# Patient Record
Sex: Female | Born: 1965 | Race: Black or African American | Hispanic: No | Marital: Single | Smoking: Never smoker
Health system: Southern US, Community
[De-identification: ages and names within clinical notes are randomized; demographics above are authoritative.]

## PROBLEM LIST (undated history)

## (undated) DIAGNOSIS — E785 Hyperlipidemia, unspecified: Secondary | ICD-10-CM

## (undated) DIAGNOSIS — M545 Low back pain, unspecified: Secondary | ICD-10-CM

## (undated) DIAGNOSIS — I10 Essential (primary) hypertension: Secondary | ICD-10-CM

## (undated) HISTORY — PX: LEG SURGERY: SHX1003

---

## 1999-11-30 ENCOUNTER — Emergency Department (HOSPITAL_COMMUNITY): Admission: EM | Admit: 1999-11-30 | Discharge: 1999-11-30 | Payer: Self-pay | Admitting: Emergency Medicine

## 2000-10-30 ENCOUNTER — Emergency Department (HOSPITAL_COMMUNITY): Admission: EM | Admit: 2000-10-30 | Discharge: 2000-10-30 | Payer: Self-pay

## 2002-10-29 ENCOUNTER — Ambulatory Visit (HOSPITAL_COMMUNITY): Admission: RE | Admit: 2002-10-29 | Discharge: 2002-10-29 | Payer: Self-pay | Admitting: Obstetrics

## 2002-10-29 ENCOUNTER — Encounter: Payer: Self-pay | Admitting: Obstetrics

## 2006-11-21 ENCOUNTER — Ambulatory Visit: Payer: Self-pay | Admitting: Family Medicine

## 2006-11-21 ENCOUNTER — Encounter (INDEPENDENT_AMBULATORY_CARE_PROVIDER_SITE_OTHER): Payer: Self-pay | Admitting: Nurse Practitioner

## 2006-11-21 LAB — CONVERTED CEMR LAB
BUN: 13 mg/dL (ref 6–23)
Basophils Absolute: 0.1 10*3/uL (ref 0.0–0.1)
CO2: 26 meq/L (ref 19–32)
Calcium: 9.3 mg/dL (ref 8.4–10.5)
Chloride: 104 meq/L (ref 96–112)
Creatinine, Ser: 0.89 mg/dL (ref 0.40–1.20)
Eosinophils Relative: 4 % (ref 0–5)
Glucose, Bld: 85 mg/dL (ref 70–99)
HCT: 39 % (ref 36.0–46.0)
Lymphocytes Relative: 37 % (ref 12–46)
Neutro Abs: 2 10*3/uL (ref 1.7–7.7)
Neutrophils Relative %: 46 % (ref 43–77)
Platelets: 283 10*3/uL (ref 150–400)
RDW: 16.6 % — ABNORMAL HIGH (ref 11.5–14.0)
TSH: 0.784 microintl units/mL (ref 0.350–5.50)

## 2006-11-23 ENCOUNTER — Ambulatory Visit: Payer: Self-pay | Admitting: *Deleted

## 2006-12-10 ENCOUNTER — Ambulatory Visit: Payer: Self-pay | Admitting: Family Medicine

## 2006-12-13 ENCOUNTER — Ambulatory Visit (HOSPITAL_COMMUNITY): Admission: RE | Admit: 2006-12-13 | Discharge: 2006-12-13 | Payer: Self-pay | Admitting: Family Medicine

## 2006-12-24 ENCOUNTER — Ambulatory Visit: Payer: Self-pay | Admitting: Internal Medicine

## 2007-01-07 ENCOUNTER — Ambulatory Visit: Payer: Self-pay | Admitting: Family Medicine

## 2007-02-13 ENCOUNTER — Ambulatory Visit: Payer: Self-pay | Admitting: *Deleted

## 2007-03-20 ENCOUNTER — Ambulatory Visit: Payer: Self-pay | Admitting: Internal Medicine

## 2007-03-20 ENCOUNTER — Encounter (INDEPENDENT_AMBULATORY_CARE_PROVIDER_SITE_OTHER): Payer: Self-pay | Admitting: Nurse Practitioner

## 2007-03-20 LAB — CONVERTED CEMR LAB
CO2: 24 meq/L (ref 19–32)
Chloride: 104 meq/L (ref 96–112)
Cholesterol: 170 mg/dL (ref 0–200)
Creatinine, Ser: 0.79 mg/dL (ref 0.40–1.20)
LDL Cholesterol: 96 mg/dL (ref 0–99)
Total Bilirubin: 0.3 mg/dL (ref 0.3–1.2)
Total CHOL/HDL Ratio: 3.1
Total Protein: 7.3 g/dL (ref 6.0–8.3)
Triglycerides: 100 mg/dL (ref <150)
VLDL: 20 mg/dL (ref 0–40)

## 2007-03-29 ENCOUNTER — Ambulatory Visit (HOSPITAL_COMMUNITY): Admission: RE | Admit: 2007-03-29 | Discharge: 2007-03-29 | Payer: Self-pay | Admitting: Family Medicine

## 2009-02-19 ENCOUNTER — Inpatient Hospital Stay (HOSPITAL_COMMUNITY)
Admission: EM | Admit: 2009-02-19 | Discharge: 2009-03-06 | Payer: Self-pay | Source: Home / Self Care | Admitting: Emergency Medicine

## 2009-02-25 ENCOUNTER — Ambulatory Visit: Payer: Self-pay | Admitting: Vascular Surgery

## 2009-02-25 ENCOUNTER — Encounter (INDEPENDENT_AMBULATORY_CARE_PROVIDER_SITE_OTHER): Payer: Self-pay | Admitting: General Surgery

## 2009-03-03 ENCOUNTER — Encounter (INDEPENDENT_AMBULATORY_CARE_PROVIDER_SITE_OTHER): Payer: Self-pay | Admitting: General Surgery

## 2010-04-11 LAB — URINALYSIS, ROUTINE W REFLEX MICROSCOPIC
Bilirubin Urine: NEGATIVE
Glucose, UA: NEGATIVE mg/dL
Ketones, ur: NEGATIVE mg/dL
Nitrite: NEGATIVE
pH: 6 (ref 5.0–8.0)

## 2010-04-11 LAB — POCT I-STAT 4, (NA,K, GLUC, HGB,HCT)
Glucose, Bld: 123 mg/dL — ABNORMAL HIGH (ref 70–99)
Glucose, Bld: 97 mg/dL (ref 70–99)
HCT: 41 % (ref 36.0–46.0)
Hemoglobin: 13.9 g/dL (ref 12.0–15.0)
Hemoglobin: 8.5 g/dL — ABNORMAL LOW (ref 12.0–15.0)
Potassium: 3.9 mEq/L (ref 3.5–5.1)
Potassium: 3.9 mEq/L (ref 3.5–5.1)
Sodium: 138 mEq/L (ref 135–145)
Sodium: 138 mEq/L (ref 135–145)

## 2010-04-11 LAB — COMPREHENSIVE METABOLIC PANEL
Alkaline Phosphatase: 88 U/L (ref 39–117)
BUN: 11 mg/dL (ref 6–23)
Calcium: 8.8 mg/dL (ref 8.4–10.5)
Creatinine, Ser: 0.83 mg/dL (ref 0.4–1.2)
GFR calc Af Amer: >60 mL/min (ref >60)
Glucose, Bld: 104 mg/dL — ABNORMAL HIGH (ref 70–99)
Total Protein: 7 g/dL (ref 6.0–8.3)

## 2010-04-11 LAB — TYPE AND SCREEN
ABO/RH(D): B POS
Antibody Screen: NEGATIVE

## 2010-04-11 LAB — URINE CULTURE: Colony Count: NO GROWTH

## 2010-04-11 LAB — CBC
HCT: 21.9 % — ABNORMAL LOW (ref 36.0–46.0)
HCT: 23.9 % — ABNORMAL LOW (ref 36.0–46.0)
HCT: 29.1 % — ABNORMAL LOW (ref 36.0–46.0)
Hemoglobin: 7.2 g/dL — ABNORMAL LOW (ref 12.0–15.0)
Hemoglobin: 9.6 g/dL — ABNORMAL LOW (ref 12.0–15.0)
MCHC: 32.8 g/dL (ref 30.0–36.0)
MCV: 90.5 fL (ref 78.0–100.0)
MCV: 90.9 fL (ref 78.0–100.0)
Platelets: 201 10*3/uL (ref 150–400)
Platelets: 231 10*3/uL (ref 150–400)
RBC: 3.23 MIL/uL — ABNORMAL LOW (ref 3.87–5.11)
RBC: 4.5 MIL/uL (ref 3.87–5.11)
RDW: 13.4 % (ref 11.5–15.5)
WBC: 13.3 10*3/uL — ABNORMAL HIGH (ref 4.0–10.5)
WBC: 7.5 10*3/uL (ref 4.0–10.5)
WBC: 8 10*3/uL (ref 4.0–10.5)

## 2010-04-11 LAB — BASIC METABOLIC PANEL
BUN: 5 mg/dL — ABNORMAL LOW (ref 6–23)
CO2: 27 mEq/L (ref 19–32)
Chloride: 105 mEq/L (ref 96–112)
Chloride: 106 mEq/L (ref 96–112)
GFR calc non Af Amer: >60 mL/min (ref >60)
GFR calc non Af Amer: >60 mL/min (ref >60)
Glucose, Bld: 122 mg/dL — ABNORMAL HIGH (ref 70–99)
Glucose, Bld: 162 mg/dL — ABNORMAL HIGH (ref 70–99)
Potassium: 3.4 mEq/L — ABNORMAL LOW (ref 3.5–5.1)
Potassium: 3.5 mEq/L (ref 3.5–5.1)
Potassium: 4 mEq/L (ref 3.5–5.1)
Sodium: 136 mEq/L (ref 135–145)
Sodium: 139 mEq/L (ref 135–145)

## 2010-04-11 LAB — URINE MICROSCOPIC-ADD ON

## 2010-04-11 LAB — HEMOGLOBIN AND HEMATOCRIT, BLOOD
HCT: 23.2 % — ABNORMAL LOW (ref 36.0–46.0)
Hemoglobin: 7.6 g/dL — ABNORMAL LOW (ref 12.0–15.0)

## 2010-04-11 LAB — PROTIME-INR: Prothrombin Time: 12.5 seconds (ref 11.6–15.2)

## 2010-04-11 LAB — POCT I-STAT, CHEM 8
Hemoglobin: 14.3 g/dL (ref 12.0–15.0)
Potassium: 3.6 mEq/L (ref 3.5–5.1)
Sodium: 140 mEq/L (ref 135–145)
TCO2: 26 mmol/L (ref 0–100)

## 2010-04-11 LAB — ABO/RH: ABO/RH(D): B POS

## 2010-04-11 LAB — POCT PREGNANCY, URINE: Preg Test, Ur: NEGATIVE

## 2010-04-11 LAB — APTT: aPTT: 29 seconds (ref 24–37)

## 2010-04-14 LAB — URINE MICROSCOPIC-ADD ON

## 2010-04-14 LAB — PROTIME-INR
INR: 1.6 — ABNORMAL HIGH (ref 0.00–1.49)
INR: 1.81 — ABNORMAL HIGH (ref 0.00–1.49)
INR: 1.82 — ABNORMAL HIGH (ref 0.00–1.49)
INR: 1.85 — ABNORMAL HIGH (ref 0.00–1.49)
INR: 2 — ABNORMAL HIGH (ref 0.00–1.49)
INR: 2.08 — ABNORMAL HIGH (ref 0.00–1.49)
INR: 2.15 — ABNORMAL HIGH (ref 0.00–1.49)
INR: 2.37 — ABNORMAL HIGH (ref 0.00–1.49)
INR: 3.1 — ABNORMAL HIGH (ref 0.00–1.49)
Prothrombin Time: 18.9 seconds — ABNORMAL HIGH (ref 11.6–15.2)
Prothrombin Time: 20.8 seconds — ABNORMAL HIGH (ref 11.6–15.2)
Prothrombin Time: 21.2 seconds — ABNORMAL HIGH (ref 11.6–15.2)
Prothrombin Time: 23.8 seconds — ABNORMAL HIGH (ref 11.6–15.2)
Prothrombin Time: 25.5 seconds — ABNORMAL HIGH (ref 11.6–15.2)

## 2010-04-14 LAB — CBC
HCT: 20.4 % — ABNORMAL LOW (ref 36.0–46.0)
HCT: 24.4 % — ABNORMAL LOW (ref 36.0–46.0)
Hemoglobin: 6.9 g/dL — CL (ref 12.0–15.0)
Hemoglobin: 7.7 g/dL — ABNORMAL LOW (ref 12.0–15.0)
Hemoglobin: 7.9 g/dL — ABNORMAL LOW (ref 12.0–15.0)
Hemoglobin: 8.2 g/dL — ABNORMAL LOW (ref 12.0–15.0)
MCHC: 33.8 g/dL (ref 30.0–36.0)
MCHC: 34.3 g/dL (ref 30.0–36.0)
MCV: 90.1 fL (ref 78.0–100.0)
MCV: 90.7 fL (ref 78.0–100.0)
Platelets: 177 10*3/uL (ref 150–400)
Platelets: 337 10*3/uL (ref 150–400)
Platelets: 395 10*3/uL (ref 150–400)
RBC: 2.53 MIL/uL — ABNORMAL LOW (ref 3.87–5.11)
RBC: 2.63 MIL/uL — ABNORMAL LOW (ref 3.87–5.11)
RBC: 3 MIL/uL — ABNORMAL LOW (ref 3.87–5.11)
RDW: 14.1 % (ref 11.5–15.5)
RDW: 14.3 % (ref 11.5–15.5)
RDW: 14.5 % (ref 11.5–15.5)
WBC: 10 10*3/uL (ref 4.0–10.5)
WBC: 11.5 10*3/uL — ABNORMAL HIGH (ref 4.0–10.5)
WBC: 11.6 10*3/uL — ABNORMAL HIGH (ref 4.0–10.5)
WBC: 13.7 10*3/uL — ABNORMAL HIGH (ref 4.0–10.5)
WBC: 9.9 10*3/uL (ref 4.0–10.5)

## 2010-04-14 LAB — BASIC METABOLIC PANEL
Calcium: 7.8 mg/dL — ABNORMAL LOW (ref 8.4–10.5)
Chloride: 104 mEq/L (ref 96–112)
Creatinine, Ser: 0.81 mg/dL (ref 0.4–1.2)
GFR calc Af Amer: >60 mL/min (ref >60)
GFR calc Af Amer: >60 mL/min (ref >60)
GFR calc non Af Amer: >60 mL/min (ref >60)
GFR calc non Af Amer: >60 mL/min (ref >60)
Glucose, Bld: 108 mg/dL — ABNORMAL HIGH (ref 70–99)
Potassium: 3.5 mEq/L (ref 3.5–5.1)
Potassium: 3.7 mEq/L (ref 3.5–5.1)
Sodium: 136 mEq/L (ref 135–145)
Sodium: 138 mEq/L (ref 135–145)

## 2010-04-14 LAB — URINE CULTURE
Colony Count: NO GROWTH
Culture: NO GROWTH

## 2010-04-14 LAB — CROSSMATCH

## 2010-04-14 LAB — CULTURE, BLOOD (ROUTINE X 2)
Culture: NO GROWTH
Culture: NO GROWTH

## 2010-04-14 LAB — URINALYSIS, ROUTINE W REFLEX MICROSCOPIC
Bilirubin Urine: NEGATIVE
Ketones, ur: 15 mg/dL — AB
Nitrite: NEGATIVE
Protein, ur: 30 mg/dL — AB
Specific Gravity, Urine: 1.03 (ref 1.005–1.030)
Urobilinogen, UA: 1 mg/dL (ref 0.0–1.0)

## 2010-04-14 LAB — DIFFERENTIAL
Basophils Absolute: 0.1 10*3/uL (ref 0.0–0.1)
Basophils Relative: 1 % (ref 0–1)
Lymphocytes Relative: 13 % (ref 12–46)
Neutro Abs: 9.3 10*3/uL — ABNORMAL HIGH (ref 1.7–7.7)
Neutrophils Relative %: 77 % (ref 43–77)

## 2010-04-14 LAB — EXPECTORATED SPUTUM ASSESSMENT W GRAM STAIN, RFLX TO RESP C

## 2011-04-05 ENCOUNTER — Other Ambulatory Visit (HOSPITAL_COMMUNITY): Payer: Self-pay | Admitting: Interventional Radiology

## 2011-05-03 ENCOUNTER — Other Ambulatory Visit (HOSPITAL_COMMUNITY): Payer: Self-pay | Admitting: Internal Medicine

## 2011-05-03 DIAGNOSIS — Z1231 Encounter for screening mammogram for malignant neoplasm of breast: Secondary | ICD-10-CM

## 2011-05-31 ENCOUNTER — Ambulatory Visit (HOSPITAL_COMMUNITY)
Admission: RE | Admit: 2011-05-31 | Discharge: 2011-05-31 | Disposition: A | Discharge status: Home / Self Care | Payer: Self-pay | Source: Ambulatory Visit | Attending: Internal Medicine | Admitting: Internal Medicine

## 2011-05-31 DIAGNOSIS — Z1231 Encounter for screening mammogram for malignant neoplasm of breast: Secondary | ICD-10-CM

## 2012-05-14 ENCOUNTER — Other Ambulatory Visit (HOSPITAL_COMMUNITY): Payer: Self-pay | Admitting: Internal Medicine

## 2012-05-14 DIAGNOSIS — Z1231 Encounter for screening mammogram for malignant neoplasm of breast: Secondary | ICD-10-CM

## 2012-06-03 ENCOUNTER — Ambulatory Visit (HOSPITAL_COMMUNITY)
Admission: RE | Admit: 2012-06-03 | Discharge: 2012-06-03 | Disposition: A | Discharge status: Home / Self Care | Payer: Self-pay | Source: Ambulatory Visit | Attending: Internal Medicine | Admitting: Internal Medicine

## 2012-06-03 DIAGNOSIS — Z1231 Encounter for screening mammogram for malignant neoplasm of breast: Secondary | ICD-10-CM

## 2012-06-04 ENCOUNTER — Other Ambulatory Visit (HOSPITAL_COMMUNITY): Payer: Self-pay | Admitting: Internal Medicine

## 2012-06-04 DIAGNOSIS — Z1231 Encounter for screening mammogram for malignant neoplasm of breast: Secondary | ICD-10-CM

## 2012-06-06 ENCOUNTER — Ambulatory Visit (HOSPITAL_COMMUNITY)
Admission: RE | Admit: 2012-06-06 | Discharge: 2012-06-06 | Disposition: A | Discharge status: Home / Self Care | Payer: Self-pay | Source: Ambulatory Visit | Attending: Internal Medicine | Admitting: Internal Medicine

## 2013-04-29 ENCOUNTER — Other Ambulatory Visit (HOSPITAL_COMMUNITY): Payer: Self-pay | Admitting: Nurse Practitioner

## 2013-04-29 DIAGNOSIS — Z1231 Encounter for screening mammogram for malignant neoplasm of breast: Secondary | ICD-10-CM

## 2013-06-09 ENCOUNTER — Ambulatory Visit (HOSPITAL_COMMUNITY): Admission: RE | Admit: 2013-06-09 | Payer: Self-pay | Source: Ambulatory Visit

## 2013-06-27 ENCOUNTER — Ambulatory Visit (HOSPITAL_COMMUNITY)
Admission: RE | Admit: 2013-06-27 | Discharge: 2013-06-27 | Disposition: A | Discharge status: Home / Self Care | Payer: Medicaid Other | Source: Ambulatory Visit | Attending: Nurse Practitioner | Admitting: Nurse Practitioner

## 2013-06-27 DIAGNOSIS — Z1231 Encounter for screening mammogram for malignant neoplasm of breast: Secondary | ICD-10-CM

## 2013-12-16 ENCOUNTER — Ambulatory Visit: Payer: Medicaid Other | Attending: Orthopedic Surgery | Admitting: Physical Therapy

## 2013-12-16 DIAGNOSIS — M5442 Lumbago with sciatica, left side: Secondary | ICD-10-CM | POA: Insufficient documentation

## 2013-12-16 DIAGNOSIS — M545 Low back pain: Secondary | ICD-10-CM

## 2013-12-16 DIAGNOSIS — M5441 Lumbago with sciatica, right side: Secondary | ICD-10-CM | POA: Diagnosis not present

## 2013-12-16 DIAGNOSIS — I10 Essential (primary) hypertension: Secondary | ICD-10-CM | POA: Diagnosis not present

## 2013-12-16 NOTE — Patient Instructions (Signed)
Leg Extension (Hamstring)   Sit toward front edge of chair, with leg out straight, heel on floor, toes pointing toward body. Keeping back straight, bend forward at hip, breathing out through pursed lips. Return, breathing in. Repeat __2-3_ times. Repeat with other leg. Do _1-2__ sessions per day. Variation: Perform from standing position, with support.  Hamstring Step 1   Straighten left knee. Keep knee level with other knee or on bolster. Hold _30__ seconds. Relax knee by returning foot to start. Repeat _3__ times.  Hamstring Stretch   With other leg bent, foot flat, grasp right leg and slowly try to straighten knee. Hold __30__ seconds. Repeat __2-3__ times. Do __1-2__ sessions per day.  http://[email or web address].exer.us/279     http://orth.exer.us/661   Copyright  VHI. All rights reserved.   Knee to Chest (Flexion)   Pull knee toward chest. Feel stretch in lower back or buttock area. Breathing deeply, Hold __30_ seconds. Repeat with other knee. Repeat __2-3__ times. Do ___1-2_ sessions per day.  Isometric Abdominal   Lying on back with knees bent,INHALE.  As you exhale, hollow belly by pulling your belly button in towards your spine. Hold ____ seconds. Repeat __10__ times per set. Do ___1_ sets per session. Do 1-2_ sessions per day.   Copyright  VHI. All rights reserved.     Posture Tips DO: - stand tall and erect - keep chin tucked in - keep head and shoulders in alignment - check posture regularly in mirror or large window - pull head back against headrest in car seat;  Change your position often.  Sit with lumbar support. DON'T: - slouch or slump while watching TV or reading - sit, stand or lie in one position  for too long;  Sitting is especially hard on the spine so if you sit at a desk/use the computer, then stand up often!   Copyright  VHI. All rights reserved.  Posture - Standing   Good posture is important. Avoid slouching and forward head thrust. Maintain curve  in low back and align ears over shoul- ders, hips over ankles.  Pull your belly button in toward your back bone.   Copyright  VHI. All rights reserved.  Posture - Sitting   Sit upright, head facing forward. Try using a roll to support lower back. Keep shoulders relaxed, and avoid rounded back. Keep hips level with knees. Avoid crossing legs for long periods.   Copyright  VHI. All rights reserved.   One Knee   Slide object up one thigh, and hold close at waist level with both hands before standing up.   Copyright  VHI. All rights reserved.  Lifting Principles .Maintain proper posture and head alignment. .Slide object as close as possible before lifting. .Move obstacles out of the way. .Test before lifting; ask for help if too heavy. .Tighten stomach muscles without holding breath. .Use smooth movements; do not jerk. .Use legs to do the work, and pivot with feet. .Distribute the work load symmetrically and close to the center of trunk. .Push instead of pull whenever possible.  Copyright  VHI. All rights reserved.  Deep Squat   Squat and lift with both arms held against upper trunk. Tighten stomach muscles without holding breath. Use smooth movements to avoid jerking.  Copyright  VHI. All rights reserved.  Low Shelf   Squat down, and bring item close to lift.   Copyright  VHI. All rights reserved.
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
## 2014-02-25 ENCOUNTER — Ambulatory Visit: Payer: Medicaid Other | Attending: Orthopedic Surgery

## 2014-02-25 DIAGNOSIS — M5441 Lumbago with sciatica, right side: Secondary | ICD-10-CM | POA: Insufficient documentation

## 2014-02-25 DIAGNOSIS — I10 Essential (primary) hypertension: Secondary | ICD-10-CM | POA: Insufficient documentation

## 2014-02-25 DIAGNOSIS — M5442 Lumbago with sciatica, left side: Secondary | ICD-10-CM | POA: Insufficient documentation

## 2014-02-25 DIAGNOSIS — M545 Low back pain: Secondary | ICD-10-CM

## 2014-02-25 NOTE — Therapy (Signed)
[facility] [address] [place], [place], [address] Phone: [phone]   Fax:  [phone]  Physical Therapy Treatment  Patient Details  Name: Morgan Jacobson MRN: [id number] Date of Birth: 02/04/1965 Referring Provider:  Gyarteng-Dakwa, Kwadwo,*  Encounter Date: 02/25/2014    No past medical history on file.  No past surgical history on file.  LMP 03/19/2012  Visit Diagnosis:  Bilateral low back pain, with sciatica presence unspecified                                      Plan - 02/25/14 0811    PT Next Visit Plan She was referred by new MD (Dakwa) and since she had an Evaluation in past 3 months it did not seem to be appropriate to charge her for a new eval so no eval done   Consulted and Agree with Plan of Care Patient        Problem List There are no active problems to display for this patient.   Chasse, Stephen M PT 02/25/2014, 8:14 AM  [facility] [address] [place], [place], [address] Phone: [phone]   Fax:  [phone]  PHYSICAL THERAPY DISCHARGE SUMMARY  Visits from Start of Care: 2  Current functional level related to goals / functional outcomes: No goals as she was not treated   Remaining deficits: No changes   Education / Equipment: She received a HEP at Eval in 11/2014  Plan: Patient agrees to discharge.  Patient goals were not met. Patient is being discharged due to financial reasons.  ?????        

## 2014-06-08 ENCOUNTER — Other Ambulatory Visit (HOSPITAL_COMMUNITY): Payer: Self-pay | Admitting: Nurse Practitioner

## 2014-06-08 DIAGNOSIS — Z1231 Encounter for screening mammogram for malignant neoplasm of breast: Secondary | ICD-10-CM

## 2014-06-30 ENCOUNTER — Ambulatory Visit (HOSPITAL_COMMUNITY)
Admission: RE | Admit: 2014-06-30 | Discharge: 2014-06-30 | Disposition: A | Discharge status: Home / Self Care | Payer: Medicaid Other | Source: Ambulatory Visit | Attending: Nurse Practitioner | Admitting: Nurse Practitioner

## 2014-06-30 DIAGNOSIS — Z1231 Encounter for screening mammogram for malignant neoplasm of breast: Secondary | ICD-10-CM | POA: Insufficient documentation

## 2015-05-27 ENCOUNTER — Other Ambulatory Visit: Payer: Self-pay

## 2015-05-27 DIAGNOSIS — Z1231 Encounter for screening mammogram for malignant neoplasm of breast: Secondary | ICD-10-CM

## 2015-07-01 ENCOUNTER — Ambulatory Visit: Payer: Medicaid Other

## 2015-07-05 ENCOUNTER — Ambulatory Visit: Payer: Medicaid Other

## 2015-07-12 ENCOUNTER — Ambulatory Visit
Admission: RE | Admit: 2015-07-12 | Discharge: 2015-07-12 | Disposition: A | Discharge status: Home / Self Care | Payer: Medicaid Other | Source: Ambulatory Visit

## 2015-07-12 DIAGNOSIS — Z1231 Encounter for screening mammogram for malignant neoplasm of breast: Secondary | ICD-10-CM

## 2015-09-15 ENCOUNTER — Ambulatory Visit: Payer: Self-pay | Admitting: Certified Nurse Midwife

## 2015-10-13 ENCOUNTER — Other Ambulatory Visit (HOSPITAL_COMMUNITY)
Admission: RE | Admit: 2015-10-13 | Discharge: 2015-10-13 | Disposition: A | Discharge status: Home / Self Care | Payer: Medicaid Other | Source: Ambulatory Visit | Attending: Family Medicine | Admitting: Family Medicine

## 2015-10-13 ENCOUNTER — Other Ambulatory Visit: Payer: Self-pay | Admitting: Family Medicine

## 2015-10-13 DIAGNOSIS — N760 Acute vaginitis: Secondary | ICD-10-CM | POA: Insufficient documentation

## 2015-10-13 DIAGNOSIS — Z1151 Encounter for screening for human papillomavirus (HPV): Secondary | ICD-10-CM | POA: Diagnosis not present

## 2015-10-13 DIAGNOSIS — Z113 Encounter for screening for infections with a predominantly sexual mode of transmission: Secondary | ICD-10-CM | POA: Insufficient documentation

## 2015-10-13 DIAGNOSIS — Z01419 Encounter for gynecological examination (general) (routine) without abnormal findings: Secondary | ICD-10-CM | POA: Insufficient documentation

## 2015-10-14 LAB — CYTOLOGY - PAP

## 2016-06-01 ENCOUNTER — Other Ambulatory Visit: Payer: Self-pay | Admitting: Family Medicine

## 2016-06-01 DIAGNOSIS — Z1231 Encounter for screening mammogram for malignant neoplasm of breast: Secondary | ICD-10-CM

## 2016-07-12 ENCOUNTER — Ambulatory Visit
Admission: RE | Admit: 2016-07-12 | Discharge: 2016-07-12 | Disposition: A | Discharge status: Home / Self Care | Payer: Medicaid Other | Source: Ambulatory Visit | Attending: Family Medicine | Admitting: Family Medicine

## 2016-07-12 DIAGNOSIS — Z1231 Encounter for screening mammogram for malignant neoplasm of breast: Secondary | ICD-10-CM

## 2017-06-01 ENCOUNTER — Other Ambulatory Visit: Payer: Self-pay | Admitting: Family Medicine

## 2017-06-01 DIAGNOSIS — Z1231 Encounter for screening mammogram for malignant neoplasm of breast: Secondary | ICD-10-CM

## 2017-07-16 ENCOUNTER — Ambulatory Visit: Payer: Self-pay

## 2017-08-08 ENCOUNTER — Ambulatory Visit: Payer: Medicaid Other

## 2017-09-04 ENCOUNTER — Ambulatory Visit: Payer: Medicaid Other

## 2017-10-03 ENCOUNTER — Ambulatory Visit: Payer: Medicaid Other

## 2017-11-06 ENCOUNTER — Ambulatory Visit: Payer: Medicaid Other

## 2017-12-13 ENCOUNTER — Ambulatory Visit
Admission: RE | Admit: 2017-12-13 | Discharge: 2017-12-13 | Disposition: A | Discharge status: Home / Self Care | Payer: Medicaid Other | Source: Ambulatory Visit | Attending: Family Medicine | Admitting: Family Medicine

## 2017-12-13 DIAGNOSIS — Z1231 Encounter for screening mammogram for malignant neoplasm of breast: Secondary | ICD-10-CM

## 2018-11-04 ENCOUNTER — Other Ambulatory Visit: Payer: Self-pay | Admitting: Family Medicine

## 2018-11-04 DIAGNOSIS — Z1231 Encounter for screening mammogram for malignant neoplasm of breast: Secondary | ICD-10-CM

## 2018-12-18 ENCOUNTER — Ambulatory Visit: Payer: Medicaid Other

## 2019-02-11 ENCOUNTER — Other Ambulatory Visit: Payer: Self-pay

## 2019-02-11 ENCOUNTER — Ambulatory Visit
Admission: RE | Admit: 2019-02-11 | Discharge: 2019-02-11 | Disposition: A | Discharge status: Home / Self Care | Payer: Medicaid Other | Source: Ambulatory Visit | Attending: Family Medicine | Admitting: Family Medicine

## 2019-02-11 DIAGNOSIS — Z1231 Encounter for screening mammogram for malignant neoplasm of breast: Secondary | ICD-10-CM

## 2019-02-25 ENCOUNTER — Other Ambulatory Visit: Payer: Self-pay | Admitting: Family Medicine

## 2019-02-25 ENCOUNTER — Ambulatory Visit
Admission: RE | Admit: 2019-02-25 | Discharge: 2019-02-25 | Disposition: A | Discharge status: Home / Self Care | Payer: Medicaid Other | Source: Ambulatory Visit | Attending: Family Medicine | Admitting: Family Medicine

## 2019-02-25 DIAGNOSIS — M545 Low back pain, unspecified: Secondary | ICD-10-CM

## 2019-03-18 ENCOUNTER — Encounter (HOSPITAL_COMMUNITY): Payer: Self-pay

## 2019-03-18 ENCOUNTER — Other Ambulatory Visit: Payer: Self-pay

## 2019-03-18 ENCOUNTER — Ambulatory Visit (HOSPITAL_COMMUNITY)
Admission: EM | Admit: 2019-03-18 | Discharge: 2019-03-18 | Disposition: A | Discharge status: Home / Self Care | Payer: Medicaid Other

## 2019-03-18 DIAGNOSIS — M5441 Lumbago with sciatica, right side: Secondary | ICD-10-CM | POA: Diagnosis not present

## 2019-03-18 DIAGNOSIS — M5417 Radiculopathy, lumbosacral region: Secondary | ICD-10-CM

## 2019-03-18 DIAGNOSIS — G8929 Other chronic pain: Secondary | ICD-10-CM

## 2019-03-18 DIAGNOSIS — M5442 Lumbago with sciatica, left side: Secondary | ICD-10-CM

## 2019-03-18 MED ORDER — DICLOFENAC SODIUM 1 % EX GEL: 2.0000 g | Freq: Four times a day (QID) | CUTANEOUS | 0 refills | Status: AC

## 2019-03-18 MED ORDER — TRAMADOL HCL 50 MG PO TABS: 50.0000 mg | ORAL_TABLET | Freq: Two times a day (BID) | ORAL | 0 refills | Status: AC | PRN
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
## 2019-03-18 NOTE — Discharge Instructions (Addendum)
I would like for you to call your primary care to discuss with their follow-up plan was with regard to your back pain and leg numbness.  I have also given you orthopedics and sports medicine office phone number options and you may call these offices to further discuss your care.  Ultimately I believe you need an MRI.  I have sent in a prescription for tramadol I would like for you to take this primarily at night.  I have also sent in for Voltaren gel and I would like for you to apply this to the area of pain on your lower back.  If you have loss of control of your bowel or bladder or have numbness in this area I would like for you to go to the emergency department.
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
## 2019-03-27 ENCOUNTER — Ambulatory Visit: Payer: Medicaid Other | Admitting: Orthopedic Surgery

## 2019-04-30 ENCOUNTER — Other Ambulatory Visit: Payer: Self-pay

## 2019-04-30 ENCOUNTER — Ambulatory Visit: Payer: Medicaid Other | Attending: Sports Medicine | Admitting: Physical Therapy

## 2019-04-30 DIAGNOSIS — M6281 Muscle weakness (generalized): Secondary | ICD-10-CM | POA: Insufficient documentation

## 2019-04-30 DIAGNOSIS — R296 Repeated falls: Secondary | ICD-10-CM | POA: Insufficient documentation

## 2019-04-30 DIAGNOSIS — G8929 Other chronic pain: Secondary | ICD-10-CM | POA: Insufficient documentation

## 2019-04-30 DIAGNOSIS — M545 Low back pain, unspecified: Secondary | ICD-10-CM

## 2019-04-30 DIAGNOSIS — R2689 Other abnormalities of gait and mobility: Secondary | ICD-10-CM | POA: Diagnosis present

## 2019-04-30 NOTE — Patient Instructions (Signed)
Access Code: [email or web address] URL: [email or web address] Date: 04/30/2019 Prepared by: Rosana Hoes  Exercises Supine Lower Trunk Rotation - 2-3 x daily - 7 x weekly - 10 reps - 5 seconds hold Supine Piriformis Stretch with Foot on Ground - 2-3 x daily - 7 x weekly - 3 reps - 20 seconds hold Bridge - 2-3 x daily - 7 x weekly - 2 sets - 10 reps - 2-3 seconds hold Beginner Clam - 2-3 x daily - 7 x weekly - 2 sets - 15 reps Seated Long Arc Quad - 2-3 x daily - 7 x weekly - 2 sets - 15 reps
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
## 2019-05-09 ENCOUNTER — Ambulatory Visit: Payer: Medicaid Other

## 2019-05-09 ENCOUNTER — Other Ambulatory Visit: Payer: Self-pay

## 2019-05-09 DIAGNOSIS — M545 Low back pain: Secondary | ICD-10-CM | POA: Diagnosis not present

## 2019-05-09 DIAGNOSIS — R2689 Other abnormalities of gait and mobility: Secondary | ICD-10-CM

## 2019-05-09 DIAGNOSIS — R296 Repeated falls: Secondary | ICD-10-CM

## 2019-05-09 DIAGNOSIS — G8929 Other chronic pain: Secondary | ICD-10-CM

## 2019-05-09 DIAGNOSIS — M6281 Muscle weakness (generalized): Secondary | ICD-10-CM

## 2019-05-09 NOTE — Therapy (Signed)
[facility] [address] [place], [place], [address] Phone: [phone]   Fax:  [phone]  Physical Therapy Treatment  Patient Details  Name: Morgan Jacobson MRN: [id number] Date of Birth: [birth year] Referring Provider (PT): Pati Gallo, MD   Encounter Date: 05/09/2019  PT End of Session - 05/09/19 1240    Visit Number  2    Number of Visits  8    Date for PT Re-Evaluation  06/25/19    Authorization Type  MCD    PT Start Time  0832    PT Stop Time  0917    PT Time Calculation (min)  45 min    Activity Tolerance  Patient tolerated treatment well    Behavior During Therapy  [facility] for tasks assessed/performed       History reviewed. No pertinent past medical history.  History reviewed. No pertinent surgical history.  There were no vitals filed for this visit.  Subjective Assessment - 05/09/19 0840    Subjective  Pt reports she doing well today. She states she is sleepinng well, but when she gets up she has stiffness in her low back and hips.    Currently in Pain?  Yes    Pain Score  7     Pain Location  Back    Pain Orientation  Lower    Pain Descriptors / Indicators  Aching;Tightness    Pain Type  Chronic pain    Pain Radiating Towards  not currently    Pain Onset  More than a month ago    Pain Frequency  Constant    Aggravating Factors   Walking, standing, stairs    Pain Relieving Factors  Meds, rest    Effect of Pain on Daily Activities  Limits walking and standing                       [facility] Adult PT Treatment/Exercise - 05/09/19 0001      Lumbar Exercises: Stretches   Lower Trunk Rotation Limitations  5 sec hold x10    Piriformis Stretch  3 reps;20 seconds    Piriformis Stretch Limitations  supine      Lumbar Exercises: Aerobic   Nustep  9 mins; level 3; arm/legs      Lumbar Exercises: Seated   Long Arc Quad on Chair  2 sets;15 reps      Lumbar Exercises: Supine   Bridge  10  reps    Bridge Limitations  partial range    Straight Leg Raise  10 reps;3 seconds    Straight Leg Raises Limitations  c quad set      Lumbar Exercises: Sidelying   Clam  15 reps   2 sets            PT Education - 05/09/19 1239    Education Details  New exs added to HEP    Person(s) Educated  Patient    Methods  Explanation;Demonstration;Tactile cues;Verbal cues;Handout    Comprehension  Tactile cues required;Need further instruction;Verbal cues required;Returned demonstration;Verbalized understanding       PT Short Term Goals - 04/30/19 1355      PT SHORT TERM GOAL #1   Title  Patient will be I with initial HEP to progress in therapy    Baseline  HEP given at evaluation    Time  4    Period  Weeks    Status  New    Target Date  05/28/19  PT SHORT TERM GOAL #2   Title  Patient will be able to negotiate 1 flight of stairs with little to no difficulty    Baseline  A lot of difficulty    Time  4    Period  Weeks    Status  New    Target Date  05/28/19      PT SHORT TERM GOAL #3   Title  Patient will be able to walk >/= 15 minutes without rest break to improve ability to grocery shop and access community    Baseline  5 minutes    Time  4    Period  Weeks    Status  New    Target Date  05/28/19      PT SHORT TERM GOAL #4   Title  Patient will report </= 5/10 pain level with walking to improve functional mobility    Baseline  8/10 pain level    Time  4    Period  Weeks    Status  New    Target Date  05/28/19      PT SHORT TERM GOAL #5   Title  Patient will be able to perform light household tasks with little to no difficulty    Baseline  Moderate difficulty    Time  4    Period  Weeks    Status  New    Target Date  05/28/19        PT Long Term Goals - 04/30/19 1356      PT LONG TERM GOAL #1   Title  Patient will be I with final HEP to maintain progress from PT    Time  8    Period  Weeks    Status  New    Target Date  06/25/19      PT LONG  TERM GOAL #2   Title  Patient will exhibit gross hip strength of >/= 4/5 MMT bilaterally and knee strength of >/= 5/5 MMT bilaterally to improve walking and stair negotiation    Time  8    Period  Weeks    Status  New    Target Date  06/25/19      PT LONG TERM GOAL #3   Title  Patient will demonstrate improve single leg stance to >/= 20 sec bilaterally to improve balance and reduce fall risk    Time  8    Period  Weeks    Status  New    Target Date  06/25/19      PT LONG TERM GOAL #4   Title  Patient will report </= 3/10 pain level with walking community level distances    Time  8    Period  Weeks    Status  New    Target Date  06/25/19      PT LONG TERM GOAL #5   Title  Patient will be able to perform heavy household tasks with little to no difficulty    Time  8    Period  Weeks    Status  New    Target Date  06/25/19            Plan - 05/09/19 1242    Clinical Impression Statement  Pt returns to PT walking with the [facility] properly at a decreased pace. Addtitional ther ex/HEP were completedand added to current HEP. Pt tolerated the exs well and completed properly. Pt will benefit from OPPT to address core/LE flexibility and strength.  PT Treatment/Interventions  ADLs/Self Care Home Management;Cryotherapy;Electrical Stimulation;Moist Heat;Gait training;Stair training;Functional mobility training;Therapeutic activities;Therapeutic exercise;Balance training;Neuromuscular re-education;Patient/family education;Manual techniques;Dry needling;Passive range of motion;Spinal Manipulations;Joint Manipulations    PT Next Visit Plan  Assess response to HEP.    PT Home Exercise Plan  [id number]: Added: SLR c quad set and standing leg curl to HEP. Currently, supine LTR, piriformis stretch, brigde (partial range), side clamshell, seated LAQ       Patient will benefit from skilled therapeutic intervention in order to improve the following deficits and impairments:  Abnormal gait, Decreased  range of motion, Decreased activity tolerance, Pain, Decreased balance, Improper body mechanics, Postural dysfunction, Decreased strength, Decreased endurance  Visit Diagnosis: Chronic bilateral low back pain, unspecified whether sciatica present  Muscle weakness (generalized)  Other abnormalities of gait and mobility  Repeated falls     Problem List There are no problems to display for this patient.  Gar Ponto MS, PT 05/09/19 12:52 PM   [place] [facility] [address] [place], [place], [address] Phone: [phone]   Fax:  [phone]  Name: Morgan Jacobson MRN: [id number] Date of Birth: [birth year]

## 2019-05-12 ENCOUNTER — Ambulatory Visit (HOSPITAL_COMMUNITY)
Admission: EM | Admit: 2019-05-12 | Discharge: 2019-05-12 | Disposition: A | Discharge status: Home / Self Care | Payer: Medicaid Other | Attending: Family Medicine | Admitting: Family Medicine

## 2019-05-12 DIAGNOSIS — K122 Cellulitis and abscess of mouth: Secondary | ICD-10-CM | POA: Diagnosis not present

## 2019-05-12 MED ORDER — AMOXICILLIN-POT CLAVULANATE 875-125 MG PO TABS: 1.0000 | ORAL_TABLET | Freq: Two times a day (BID) | ORAL | 0 refills | Status: DC

## 2019-05-12 MED ORDER — HYDROCODONE-ACETAMINOPHEN 7.5-325 MG PO TABS: 1.0000 | ORAL_TABLET | Freq: Four times a day (QID) | ORAL | 0 refills | Status: DC | PRN

## 2019-05-12 NOTE — Discharge Instructions (Addendum)
Take the antibiotic 2 x a day Take 2 doses today This is a strong antibiotic, consider taking a probiotic with it Take the pain medication as needed Do not drive on the pain pills If not improving in 2 days you need to see a dentist or oral surgeon Call if you need help with a refferal

## 2019-05-12 NOTE — ED Triage Notes (Signed)
Pt here for sore throat, dry cough and intermittent runny nose x 1 week

## 2019-05-12 NOTE — ED Provider Notes (Signed)
[place]    CSN: [id number] Arrival date & time: 05/12/19  2778      History   Chief Complaint Chief Complaint  Patient presents with  . Sore Throat    HPI Morgan Jacobson is a 54 y.o. female.   HPI  Patient is here for sore throat.  She states is been bothering her for about a week.  She has intermittent runny nose that she thinks is from allergy.  She i has had no exposure to strep or to Covid.  No fever or chills.  No headache or body ache.  Painful to swallow.  She has a painful lump in the roof of her mouth. She does have a history of hypertension.  She states this is usually well controlled.  No past medical history on file.  There are no problems to display for this patient.   No past surgical history on file.  OB History   No obstetric history on file.      Home Medications    Prior to Admission medications   Medication Sig Start Date End Date Taking? Authorizing Provider  amLODipine (NORVASC) 10 MG tablet Take 10 mg by mouth daily. 01/05/19   [provider]  amoxicillin-clavulanate (AUGMENTIN) 875-125 MG tablet Take 1 tablet by mouth every 12 (twelve) hours. 05/12/19   Raylene Everts, MD  diclofenac Sodium (VOLTAREN) 1 % GEL Apply 2 g topically 4 (four) times daily. 03/18/19   Darr, Marguerita Beards, PA-C  famotidine (PEPCID) 20 MG tablet Take 20 mg by mouth daily. 02/21/19   [provider]  hydrochlorothiazide (HYDRODIURIL) 25 MG tablet Take 25 mg by mouth daily. 01/06/19   [provider]  HYDROcodone-acetaminophen (NORCO) 7.5-325 MG tablet Take 1 tablet by mouth every 6 (six) hours as needed for moderate pain. 05/12/19   Raylene Everts, MD  ibuprofen (ADVIL) 800 MG tablet Take 800 mg by mouth 3 (three) times daily. 02/10/19   [provider]  LINZESS 145 MCG CAPS capsule Take 145 mcg by mouth daily. 10/14/18   [provider]  losartan (COZAAR) 100 MG tablet Take 100 mg by mouth daily. 01/13/19    [provider]  naproxen (NAPROSYN) 500 MG tablet Take 500 mg by mouth 2 (two) times daily. 12/05/18   [provider]  pravastatin (PRAVACHOL) 20 MG tablet Take 20 mg by mouth at bedtime. 01/21/19   [provider]  tiZANidine (ZANAFLEX) 4 MG tablet Take 4 mg by mouth 2 (two) times daily. 01/05/19   [provider]    Family History Family History  Problem Relation Age of Onset  . Stroke Mother   . Heart Problems Mother   . Stomach cancer Father     Social History Social History   Tobacco Use  . Smoking status: Never Smoker  . Smokeless tobacco: Never Used  Substance Use Topics  . Alcohol use: Not Currently  . Drug use: Never     Allergies   Tomato   Review of Systems Review of Systems  Constitutional: Negative for chills and fever.  HENT: Positive for congestion, rhinorrhea, sore throat and trouble swallowing.      Physical Exam Triage Vital Signs ED Triage Vitals [05/12/19 1028]  Enc Vitals Group     BP (!) 151/90     Pulse Rate 99     Resp 16     Temp 98.4 F (36.9 C)     Temp src      SpO2  98 %     Weight      Height      Head Circumference      Peak Flow      Pain Score 6     Pain Loc      Pain Edu?      Excl. in GC?    No data found.  Updated Vital Signs BP (!) 151/90   Pulse 99   Temp 98.4 F (36.9 C)   Resp 16   LMP 03/19/2012   SpO2 98%       Physical Exam Constitutional:      General: She is not in acute distress.    Appearance: She is well-developed. She is obese.  HENT:     Head: Normocephalic and atraumatic.     Right Ear: Tympanic membrane and ear canal normal.     Left Ear: Tympanic membrane normal.     Nose: No congestion or rhinorrhea.     Mouth/Throat:     Palate: Mass and lesions present.     Pharynx: Pharyngeal swelling and posterior oropharyngeal erythema present.     Tonsils: No tonsillar exudate.      Comments: Patient does have erythematous tonsils.  Mildly swollen.  No  exudate.  She has a large bony torus.  At the posterior edge of the torus there is a swelling that is erythematous, with an ulcerated yellow appearance in the center.  Uncertain if this is an abscess that has ruptured or if it is trauma to the bony torus.  Eyes:     Conjunctiva/sclera: Conjunctivae normal.     Pupils: Pupils are equal, round, and reactive to light.  Cardiovascular:     Rate and Rhythm: Normal rate.  Pulmonary:     Effort: Pulmonary effort is normal. No respiratory distress.  Abdominal:     General: There is no distension.     Palpations: Abdomen is soft.  Musculoskeletal:        General: Normal range of motion.     Cervical back: Normal range of motion.  Skin:    General: Skin is warm and dry.  Neurological:     Mental Status: She is alert.  Psychiatric:        Mood and Affect: Mood normal.        Behavior: Behavior normal.      UC Treatments / Results  Labs (all labs ordered are listed, but only abnormal results are displayed) Labs Reviewed - No data to display  EKG   Radiology No results found.  Procedures Procedures (including critical care time)  Medications Ordered in UC Medications - No data to display  Initial Impression / Assessment and Plan / UC Course  I have reviewed the triage vital signs and the nursing notes.  Pertinent labs & imaging results that were available during my care of the patient were reviewed by me and considered in my medical decision making (see chart for details).      Final Clinical Impressions(s) / UC Diagnoses   Final diagnoses:  Abscess of palate     Discharge Instructions     Take the antibiotic 2 x a day Take 2 doses today This is a strong antibiotic, consider taking a probiotic with it Take the pain medication as needed Do not drive on the pain pills If not improving in 2 days you need to see a dentist or oral surgeon Call if you need help with a refferal     ED Prescriptions  Medication Sig  Dispense Auth. Provider   amoxicillin-clavulanate (AUGMENTIN) 875-125 MG tablet Take 1 tablet by mouth every 12 (twelve) hours. 14 tablet Eustace Moore, MD   HYDROcodone-acetaminophen [facility]) 7.5-325 MG tablet Take 1 tablet by mouth every 6 (six) hours as needed for moderate pain. 15 tablet Eustace Moore, MD     I have reviewed the PDMP during this encounter.   Eustace Moore, MD 05/12/19 [phone]

## 2019-05-13 ENCOUNTER — Ambulatory Visit: Payer: Medicaid Other | Admitting: Physical Therapy

## 2019-05-13 ENCOUNTER — Encounter: Payer: Self-pay | Admitting: Physical Therapy

## 2019-05-13 ENCOUNTER — Other Ambulatory Visit: Payer: Self-pay

## 2019-05-13 DIAGNOSIS — G8929 Other chronic pain: Secondary | ICD-10-CM

## 2019-05-13 DIAGNOSIS — M545 Low back pain, unspecified: Secondary | ICD-10-CM

## 2019-05-13 DIAGNOSIS — R296 Repeated falls: Secondary | ICD-10-CM

## 2019-05-13 DIAGNOSIS — R2689 Other abnormalities of gait and mobility: Secondary | ICD-10-CM

## 2019-05-13 DIAGNOSIS — M6281 Muscle weakness (generalized): Secondary | ICD-10-CM

## 2019-05-13 NOTE — Patient Instructions (Signed)
Access Code: [email or web address] URL: [email or web address] Date: 05/13/2019 Prepared by: Rosana Hoes  Exercises Supine Lower Trunk Rotation - 2-3 x daily - 7 x weekly - 10 reps - 5 seconds hold Supine Piriformis Stretch with Foot on Ground - 2-3 x daily - 7 x weekly - 3 reps - 20 seconds hold Clamshell with Resistance - 2-3 x daily - 7 x weekly - 2 sets - 10 reps Bridge - 2-3 x daily - 7 x weekly - 2 sets - 10 reps - 2-3 seconds hold Seated Long Arc Quad - 2-3 x daily - 7 x weekly - 2 sets - 15 reps Active Straight Leg Raise with Quad Set - 2-3 x daily - 7 x weekly - 2 sets - 10 reps Standing Knee Flexion AROM with Chair Support - 2-3 x daily - 7 x weekly - 2 sets - 10 reps Sit to Stand with Hands on Knees - 2-3 x daily - 7 x weekly - 10 reps

## 2019-05-13 NOTE — Therapy (Signed)
[facility] [address] [place], [place], [address] Phone: [phone]   Fax:  [phone]  Physical Therapy Treatment  Patient Details  Name: Morgan Jacobson MRN: [id number] Date of Birth: January 27, 1965 Referring Provider (PT): Pati Gallo, MD   Encounter Date: 05/13/2019  PT End of Session - 05/13/19 0954    Visit Number  3    Number of Visits  8    Date for PT Re-Evaluation  06/25/19    Authorization Type  MCD    Authorization Time Period  05/09/2019 - 05/29/2019    Authorization - Visit Number  2    Authorization - Number of Visits  3    PT Start Time  0952    PT Stop Time  1040    PT Time Calculation (min)  48 min    Activity Tolerance  Patient tolerated treatment well    Behavior During Therapy  [facility] for tasks assessed/performed       History reviewed. No pertinent past medical history.  History reviewed. No pertinent surgical history.  There were no vitals filed for this visit.  Subjective Assessment - 05/13/19 0956    Subjective  Patient reports she is doing pretty good. She reports exercises are going well, but after she does then she does feel stiff in lower back and legs.    Patient Stated Goals  Get pain better and legs stronger to improve walking ability and reduce falls    Currently in Pain?  Yes    Pain Score  6     Pain Location  Back    Pain Orientation  Lower    Pain Descriptors / Indicators  Aching;Tightness    Pain Type  Chronic pain    Pain Onset  More than a month ago    Pain Frequency  Constant                       [facility] Adult PT Treatment/Exercise - 05/13/19 0001      Exercises   Exercises  Lumbar      Lumbar Exercises: Stretches   Single Knee to Chest Stretch  2 reps;20 seconds    Lower Trunk Rotation Limitations  5 sec hold x10    Piriformis Stretch  2 reps;20 seconds    Piriformis Stretch Limitations  supine      Lumbar Exercises: Standing   Other Standing  Lumbar Exercises  Hamstring curl with 2# 2x10 each    Other Standing Lumbar Exercises  Hip abduction and extension with 2# 2x10 each      Lumbar Exercises: Seated   Long Arc Quad on Chair  2 sets;15 reps    LAQ on Chair Weights (lbs)  2    Sit to Stand  10 reps   2 sets   Sit to Stand Limitations  hands on thighs, slightly elevated mat table 22"      Lumbar Exercises: Supine   Bridge  10 reps   2 sets   Bridge Limitations  able to clear hips from table    Straight Leg Raise  10 reps   2 sets   Straight Leg Raises Limitations  cued for quad set      Lumbar Exercises: Sidelying   Clam  15 reps   2 sets   Clam Limitations  yellow band             PT Education - 05/13/19 0954    Education Details  HEP update, adjusting cane to lower level    Person(s) Educated  Patient    Methods  Explanation;Demonstration;Verbal cues;Tactile cues;Handout    Comprehension  Verbalized understanding;Returned demonstration;Verbal cues required;Need further instruction;Tactile cues required       PT Short Term Goals - 04/30/19 1355      PT SHORT TERM GOAL #1   Title  Patient will be I with initial HEP to progress in therapy    Baseline  HEP given at evaluation    Time  4    Period  Weeks    Status  New    Target Date  05/28/19      PT SHORT TERM GOAL #2   Title  Patient will be able to negotiate 1 flight of stairs with little to no difficulty    Baseline  A lot of difficulty    Time  4    Period  Weeks    Status  New    Target Date  05/28/19      PT SHORT TERM GOAL #3   Title  Patient will be able to walk >/= 15 minutes without rest break to improve ability to grocery shop and access community    Baseline  5 minutes    Time  4    Period  Weeks    Status  New    Target Date  05/28/19      PT SHORT TERM GOAL #4   Title  Patient will report </= 5/10 pain level with walking to improve functional mobility    Baseline  8/10 pain level    Time  4    Period  Weeks    Status  New     Target Date  05/28/19      PT SHORT TERM GOAL #5   Title  Patient will be able to perform light household tasks with little to no difficulty    Baseline  Moderate difficulty    Time  4    Period  Weeks    Status  New    Target Date  05/28/19        PT Long Term Goals - 04/30/19 1356      PT LONG TERM GOAL #1   Title  Patient will be I with final HEP to maintain progress from PT    Time  8    Period  Weeks    Status  New    Target Date  06/25/19      PT LONG TERM GOAL #2   Title  Patient will exhibit gross hip strength of >/= 4/5 MMT bilaterally and knee strength of >/= 5/5 MMT bilaterally to improve walking and stair negotiation    Time  8    Period  Weeks    Status  New    Target Date  06/25/19      PT LONG TERM GOAL #3   Title  Patient will demonstrate improve single leg stance to >/= 20 sec bilaterally to improve balance and reduce fall risk    Time  8    Period  Weeks    Status  New    Target Date  06/25/19      PT LONG TERM GOAL #4   Title  Patient will report </= 3/10 pain level with walking community level distances    Time  8    Period  Weeks    Status  New    Target Date  06/25/19  PT LONG TERM GOAL #5   Title  Patient will be able to perform heavy household tasks with little to no difficulty    Time  8    Period  Weeks    Status  New    Target Date  06/25/19            Plan - 05/13/19 0955    Clinical Impression Statement  Patient tolerated therapy well with no adverse effects. She exhibits improved strength and is tolerated advancements and increased resistance with her exercises. No pain reported with exercises, she did report fatigue following therapy. Her cane was lowered this visit and she reported that if felt more comfortable with walking after adjusted. She would benefit from continued skilled PT to progress her core and hip/LE strength to reduce lower back pain and improve ambulatory ability to fall risk.    PT  Treatment/Interventions  ADLs/Self Care Home Management;Cryotherapy;Electrical Stimulation;Moist Heat;Gait training;Stair training;Functional mobility training;Therapeutic activities;Therapeutic exercise;Balance training;Neuromuscular re-education;Patient/family education;Manual techniques;Dry needling;Passive range of motion;Spinal Manipulations;Joint Manipulations    PT Next Visit Plan  Assess HEP and progress PRN, general hip and BLE strengthening, add sit<>stand, can trial step-ups    PT Home Exercise Plan  [email or web address]: supine LTR, piriformis stretch, brigde (partial range), side clamshell with yellow, seated LAQ, SLR, standing hamstring curl, sit<>stand    Consulted and Agree with Plan of Care  Patient       Patient will benefit from skilled therapeutic intervention in order to improve the following deficits and impairments:  Abnormal gait, Decreased range of motion, Decreased activity tolerance, Pain, Decreased balance, Improper body mechanics, Postural dysfunction, Decreased strength, Decreased endurance  Visit Diagnosis: Chronic bilateral low back pain, unspecified whether sciatica present  Muscle weakness (generalized)  Other abnormalities of gait and mobility  Repeated falls     Problem List There are no problems to display for this patient.   Rosana Hoes, PT, DPT, LAT, ATC 05/13/19  11:21 AM Phone: [phone] Fax: [phone]   [facility] [address] [place], [place], [address] Phone: [phone]   Fax:  [phone]  Name: Morgan Jacobson MRN: [id number] Date of Birth: 1965-03-26

## 2019-05-19 ENCOUNTER — Encounter: Payer: Medicaid Other | Admitting: Physical Therapy

## 2019-05-30 ENCOUNTER — Ambulatory Visit: Payer: Medicaid Other | Attending: Sports Medicine | Admitting: Physical Therapy

## 2019-05-30 DIAGNOSIS — R296 Repeated falls: Secondary | ICD-10-CM | POA: Insufficient documentation

## 2019-05-30 DIAGNOSIS — R2689 Other abnormalities of gait and mobility: Secondary | ICD-10-CM | POA: Insufficient documentation

## 2019-05-30 DIAGNOSIS — G8929 Other chronic pain: Secondary | ICD-10-CM | POA: Insufficient documentation

## 2019-05-30 DIAGNOSIS — M545 Low back pain: Secondary | ICD-10-CM | POA: Insufficient documentation

## 2019-05-30 DIAGNOSIS — M6281 Muscle weakness (generalized): Secondary | ICD-10-CM | POA: Insufficient documentation

## 2019-06-02 ENCOUNTER — Encounter: Payer: Self-pay | Admitting: Physical Therapy

## 2019-06-02 ENCOUNTER — Other Ambulatory Visit: Payer: Self-pay

## 2019-06-02 ENCOUNTER — Ambulatory Visit: Payer: Medicaid Other | Admitting: Physical Therapy

## 2019-06-02 DIAGNOSIS — R296 Repeated falls: Secondary | ICD-10-CM | POA: Diagnosis present

## 2019-06-02 DIAGNOSIS — R2689 Other abnormalities of gait and mobility: Secondary | ICD-10-CM | POA: Diagnosis present

## 2019-06-02 DIAGNOSIS — M545 Low back pain: Secondary | ICD-10-CM | POA: Diagnosis not present

## 2019-06-02 DIAGNOSIS — G8929 Other chronic pain: Secondary | ICD-10-CM

## 2019-06-02 DIAGNOSIS — M6281 Muscle weakness (generalized): Secondary | ICD-10-CM

## 2019-06-02 NOTE — Therapy (Signed)
[facility] Outpatient Rehabilitation [facility] [address] [place], [place], [address] Phone: [phone]   Fax:  [phone]  Physical Therapy Treatment / ERO  Progress Note Reporting Period 04/30/2019 to 06/02/2019  See note below for Objective Data and Assessment of Progress/Goals.    Patient Details  Name: Morgan Jacobson MRN: [id number] Date of Birth: 11/30/1965 Referring Provider (PT): Pati Gallo, MD   Encounter Date: 06/02/2019  PT End of Session - 06/02/19 0942    Visit Number  4    Number of Visits  8    Date for PT Re-Evaluation  06/25/19    Authorization Type  MCD    PT Start Time  0945    PT Stop Time  1030    PT Time Calculation (min)  45 min    Activity Tolerance  Patient tolerated treatment well    Behavior During Therapy  [facility] for tasks assessed/performed       History reviewed. No pertinent past medical history.  History reviewed. No pertinent surgical history.  There were no vitals filed for this visit.  Subjective Assessment - 06/02/19 0944    Subjective  Patient reports she is doing well. She reports she missed her last visit because her sister had a stroke. She states the left leg feels weak when she is standing for extended periods.    Limitations  Walking;Standing;House hold activities;Lifting    How long can you sit comfortably?  No limitation    How long can you stand comfortably?  5 minutes    How long can you walk comfortably?  20-30 minutes    Patient Stated Goals  Get pain better and legs stronger to improve walking ability and reduce falls    Currently in Pain?  Yes    Pain Score  4     Pain Location  Back    Pain Orientation  Lower    Pain Descriptors / Indicators  Aching;Tightness    Pain Type  Chronic pain    Pain Onset  More than a month ago    Pain Frequency  Constant    Aggravating Factors   Walking, standing, stairs    Pain Relieving Factors  Medication, rest    Effect of Pain on Daily Activities   Patient is limited with walking and standing tasks         [facility] PT Assessment - 06/02/19 0001      Assessment   Medical Diagnosis  Chronic low back pain    Referring Provider (PT)  Pati Gallo, MD    Onset Date/Surgical Date  03/18/19   original onset in 2011   Next MD Visit  Not scheduled    Prior Therapy  Yes - eval in 2015 for low back pain      Precautions   Precautions  Fall      Restrictions   Weight Bearing Restrictions  No      Balance Screen   Has the patient fallen in the past 6 months  Yes    How many times?  4-5 times, due to right leg weakness    Has the patient had a decrease in activity level because of a fear of falling?   Yes    Is the patient reluctant to leave their home because of a fear of falling?   No      Prior Function   Level of Independence  Independent    Vocation  On disability    Leisure  Walking  Cognition   Overall Cognitive Status  Within Functional Limits for tasks assessed      Observation/Other Assessments   Observations  Patient appears in no apparent distress    Focus on Therapeutic Outcomes (FOTO)   NA - MCD      Sensation   Light Touch  Appears Intact      Single Leg Stance   Comments  Unable to maintain SLS bilaterally, no increased pain reported      Sit to Stand   Comments  Able to perform with BUE support on thighs      AROM   Lumbar Flexion  WFL    Lumbar Extension  50% - increased low back pain    Lumbar - Right Side Bend  WFL    Lumbar - Left Side Bend  WFL    Lumbar - Right Rotation  WFL    Lumbar - Left Rotation  [facility]      Strength   Right Hip Flexion  4/5    Right Hip Extension  4-/5    Right Hip ABduction  3+/5    Left Hip Flexion  4/5    Left Hip Extension  4-/5    Left Hip ABduction  3+/5    Right Knee Flexion  4/5    Right Knee Extension  4/5    Left Knee Flexion  4/5    Left Knee Extension  4/5    Right Ankle Dorsiflexion  4+/5    Right Ankle Plantar Flexion  4/5    Left Ankle  Dorsiflexion  4+/5    Left Ankle Plantar Flexion  4/5      Transfers   Transfers  Independent with all Transfers      Ambulation/Gait   Ambulation/Gait Assistance  6: Modified independent (Device/Increase time)    Assistive device  Straight cane                   [facility] Adult PT Treatment/Exercise - 06/02/19 0001      Exercises   Exercises  Lumbar      Lumbar Exercises: Stretches   Single Knee to Chest Stretch  2 reps;20 seconds    Lower Trunk Rotation  5 reps;10 seconds    Piriformis Stretch  2 reps;20 seconds    Piriformis Stretch Limitations  supine      Lumbar Exercises: Aerobic   Nustep  L4 x 6 min (LE only)      Lumbar Exercises: Standing   Heel Raises  10 reps   2 sets   Other Standing Lumbar Exercises  Forward step-up on 6" step 2x10 each    Other Standing Lumbar Exercises  Hip abduction, extension, hamstrin curl with 2# 2x15 each      Lumbar Exercises: Seated   Long Arc Quad on Chair  2 sets;15 reps    LAQ on Chair Weights (lbs)  2    Sit to Stand  10 reps   2 sets   Sit to Stand Limitations  hands on thighs, slightly elevated mat table 22"      Lumbar Exercises: Supine   Bridge  10 reps   2 sets   Straight Leg Raise  10 reps   2 sets            PT Education - 06/02/19 0941    Education Details  HEP    Person(s) Educated  Patient    Methods  Explanation;Demonstration;Tactile cues;Verbal cues;Handout    Comprehension  Verbalized understanding;Returned demonstration;Verbal  cues required;Tactile cues required;Need further instruction       PT Short Term Goals - 06/02/19 0951      PT SHORT TERM GOAL #1   Title  Patient will be I with initial HEP to progress in therapy    Baseline  Patient continues to require cueing for exercises    Time  4    Period  Weeks    Status  On-going    Target Date  05/28/19      PT SHORT TERM GOAL #2   Title  Patient will be able to negotiate 1 flight of stairs with little to no difficulty    Baseline   Patient continues to report a lot of difficulty    Time  4    Period  Weeks    Status  On-going    Target Date  05/28/19      PT SHORT TERM GOAL #3   Title  Patient will be able to walk >/= 15 minutes without rest break to improve ability to grocery shop and access community    Baseline  Patient reports 20-30 minutes    Time  4    Period  Weeks    Status  Achieved    Target Date  05/28/19      PT SHORT TERM GOAL #4   Title  Patient will report </= 5/10 pain level with walking to improve functional mobility    Baseline  Patient reports 4/10 pain level    Time  4    Period  Weeks    Status  Achieved    Target Date  05/28/19      PT SHORT TERM GOAL #5   Title  Patient will be able to perform light household tasks with little to no difficulty    Baseline  Patient reports continued moderate difficulty    Time  4    Period  Weeks    Status  On-going    Target Date  05/28/19        PT Long Term Goals - 04/30/19 1356      PT LONG TERM GOAL #1   Title  Patient will be I with final HEP to maintain progress from PT    Time  8    Period  Weeks    Status  New    Target Date  06/25/19      PT LONG TERM GOAL #2   Title  Patient will exhibit gross hip strength of >/= 4/5 MMT bilaterally and knee strength of >/= 5/5 MMT bilaterally to improve walking and stair negotiation    Time  8    Period  Weeks    Status  New    Target Date  06/25/19      PT LONG TERM GOAL #3   Title  Patient will demonstrate improve single leg stance to >/= 20 sec bilaterally to improve balance and reduce fall risk    Time  8    Period  Weeks    Status  New    Target Date  06/25/19      PT LONG TERM GOAL #4   Title  Patient will report </= 3/10 pain level with walking community level distances    Time  8    Period  Weeks    Status  New    Target Date  06/25/19      PT LONG TERM GOAL #5   Title  Patient will be able to  perform heavy household tasks with little to no difficulty    Time  8     Period  Weeks    Status  New    Target Date  06/25/19            Plan - 06/02/19 0942    Clinical Impression Statement  Patient tolerated therapy well with no adverse effects. She is progressing well with her strengthening exercises and reports improvement in pain symptoms. She does continues to have difficulty with extended periods of standing walking annd reports limitation with going up/down stairs. Step-ups were initiated this visit using shorter step with good tolerance and patient encouraged to work on this at home. She would benefit from continued skilled PT to further progress her core and hip/LE strength to reduce lower back pain and improve ambulatory ability to reduce fall risk.    PT Frequency  1x / week    PT Duration  8 weeks    PT Treatment/Interventions  ADLs/Self Care Home Management;Cryotherapy;Electrical Stimulation;Moist Heat;Gait training;Stair training;Functional mobility training;Therapeutic activities;Therapeutic exercise;Balance training;Neuromuscular re-education;Patient/family education;Manual techniques;Dry needling;Passive range of motion;Spinal Manipulations;Joint Manipulations    PT Next Visit Plan  Assess HEP and progress PRN, general hip and BLE strengthening, progress step-ups    PT Home Exercise Plan  [email or web address]: supine LTR, piriformis stretch, brigde (partial range), side clamshell with yellow, seated LAQ, SLR, standing hamstring curl, sit<>stand, forward step-up    Consulted and Agree with Plan of Care  Patient       Patient will benefit from skilled therapeutic intervention in order to improve the following deficits and impairments:  Abnormal gait, Decreased range of motion, Decreased activity tolerance, Pain, Decreased balance, Improper body mechanics, Postural dysfunction, Decreased strength, Decreased endurance  Visit Diagnosis: Chronic bilateral low back pain, unspecified whether sciatica present  Muscle weakness (generalized)  Other abnormalities  of gait and mobility  Repeated falls     Problem List There are no problems to display for this patient.   Rosana Hoes, PT, DPT, LAT, ATC 06/02/19  10:32 AM Phone: [phone] Fax: [phone]   [facility] Outpatient Rehabilitation [facility] [address] [place], [place], [address] Phone: [phone]   Fax:  [phone]  Name: Tyrika Newman MRN: [id number] Date of Birth: 09-01-1965

## 2019-06-02 NOTE — Patient Instructions (Signed)
Access Code: [email or web address] URL: [email or web address] Date: 05/13/2019 Prepared by: Rosana Hoes  Exercises Supine Lower Trunk Rotation - 2-3 x daily - 7 x weekly - 10 reps - 5 seconds hold Supine Piriformis Stretch with Foot on Ground - 2-3 x daily - 7 x weekly - 2 reps - 20 seconds hold Clamshell with Resistance - 1-2 x daily - 7 x weekly - 2 sets - 10 reps Bridge - 1-2 x daily - 7 x weekly - 2 sets - 10 reps - 2-3 seconds hold Seated Long Arc Quad - 1-2 x daily - 7 x weekly - 2 sets - 15 reps Active Straight Leg Raise with Quad Set - 1-2 x daily - 7 x weekly - 2 sets - 10 reps Standing Knee Flexion AROM with Chair Support - 1-2 x daily - 7 x weekly - 2 sets - 10 reps Sit to Stand with Hands on Knees - 1-2 x daily - 7 x weekly - 10 reps - 2 sets Step Up - 1-2 x daily - 7 x weekly - 2 sets - 10 reps

## 2019-06-12 ENCOUNTER — Ambulatory Visit: Payer: Medicaid Other | Admitting: Physical Therapy

## 2019-06-12 ENCOUNTER — Encounter: Payer: Self-pay | Admitting: Physical Therapy

## 2019-06-12 ENCOUNTER — Other Ambulatory Visit: Payer: Self-pay

## 2019-06-12 DIAGNOSIS — M6281 Muscle weakness (generalized): Secondary | ICD-10-CM

## 2019-06-12 DIAGNOSIS — R2689 Other abnormalities of gait and mobility: Secondary | ICD-10-CM

## 2019-06-12 DIAGNOSIS — R296 Repeated falls: Secondary | ICD-10-CM

## 2019-06-12 DIAGNOSIS — G8929 Other chronic pain: Secondary | ICD-10-CM

## 2019-06-12 DIAGNOSIS — M545 Low back pain: Secondary | ICD-10-CM | POA: Diagnosis not present

## 2019-06-12 NOTE — Therapy (Signed)
[facility] [place], [place], [address] Phone: [phone]   Fax:  [phone]  Physical Therapy Treatment  Patient Details  Name: Morgan Jacobson MRN: [id number] Date of Birth: 1965-11-29 Referring Provider (PT): Berle Mull, MD   Encounter Date: 06/12/2019  PT End of Session - 06/12/19 0958    Visit Number  5    Number of Visits  8    Date for PT Re-Evaluation  06/25/19    Authorization Type  MCD    Authorization Time Period  06/09/2019 - 08/03/2019    Authorization - Visit Number  1    Authorization - Number of Visits  8    PT Start Time  0948    PT Stop Time  1037    PT Time Calculation (min)  49 min    Activity Tolerance  Patient tolerated treatment well    Behavior During Therapy  [facility] for tasks assessed/performed       History reviewed. No pertinent past medical history.  History reviewed. No pertinent surgical history.  There were no vitals filed for this visit.  Subjective Assessment - 06/12/19 0956    Subjective  Patient reports she is doing well. No new issues. She does report that she fell when she was leaving a store, she fell backwards because she feels her left leg got weak. She states she did not injure herself and she was using the cane when this happened.    Patient Stated Goals  Get pain better and legs stronger to improve walking ability and reduce falls    Currently in Pain?  Yes    Pain Score  7     Pain Location  Back    Pain Orientation  Lower    Pain Descriptors / Indicators  Aching;Sore;Tightness    Pain Type  Chronic pain    Pain Onset  More than a month ago    Pain Frequency  Constant    Aggravating Factors   Walking, standing, stairs         [facility] PT Assessment - 06/12/19 0001      Balance Screen   Has the patient fallen in the past 6 months  Yes    How many times?  1 since last visit due to left knee giving out      Sit to Stand   Comments  Able to perform  without UE assist from elevated surface                    [facility] Adult PT Treatment/Exercise - 06/12/19 0001      Exercises   Exercises  Lumbar      Lumbar Exercises: Stretches   Passive Hamstring Stretch  2 reps;20 seconds    Single Knee to Chest Stretch  2 reps;20 seconds    Lower Trunk Rotation  5 reps;10 seconds    Lower Trunk Rotation Limitations  figure-4 position    Piriformis Stretch  2 reps;20 seconds    Figure 4 Stretch  2 reps;20 seconds      Lumbar Exercises: Aerobic   Nustep  L5 x 6 min (LE only)      Lumbar Exercises: Standing   Other Standing Lumbar Exercises  Forward step-up on 6" step 2x10 each      Lumbar Exercises: Seated   Long Arc Quad on Chair  2 sets;15 reps    LAQ on Chair Weights (lbs)  2.5    Sit to Stand  10 reps   2 sets   Sit to Stand Limitations  no UE support, table slightly elevated      Lumbar Exercises: Supine   Bridge  10 reps   2 sets   Straight Leg Raise  15 reps   2 sets   Other Supine Lumbar Exercises  Clamshell with blue band 2x15             PT Education - 06/12/19 0956    Education Details  HEP    Person(s) Educated  Patient    Methods  Explanation;Demonstration;Verbal cues    Comprehension  Verbalized understanding;Returned demonstration;Verbal cues required;Need further instruction       PT Short Term Goals - 06/02/19 0951      PT SHORT TERM GOAL #1   Title  Patient will be I with initial HEP to progress in therapy    Baseline  Patient continues to require cueing for exercises    Time  4    Period  Weeks    Status  On-going    Target Date  05/28/19      PT SHORT TERM GOAL #2   Title  Patient will be able to negotiate 1 flight of stairs with little to no difficulty    Baseline  Patient continues to report a lot of difficulty    Time  4    Period  Weeks    Status  On-going    Target Date  05/28/19      PT SHORT TERM GOAL #3   Title  Patient will be able to walk >/= 15 minutes without rest  break to improve ability to grocery shop and access community    Baseline  Patient reports 20-30 minutes    Time  4    Period  Weeks    Status  Achieved    Target Date  05/28/19      PT SHORT TERM GOAL #4   Title  Patient will report </= 5/10 pain level with walking to improve functional mobility    Baseline  Patient reports 4/10 pain level    Time  4    Period  Weeks    Status  Achieved    Target Date  05/28/19      PT SHORT TERM GOAL #5   Title  Patient will be able to perform light household tasks with little to no difficulty    Baseline  Patient reports continued moderate difficulty    Time  4    Period  Weeks    Status  On-going    Target Date  05/28/19        PT Long Term Goals - 04/30/19 1356      PT LONG TERM GOAL #1   Title  Patient will be I with final HEP to maintain progress from PT    Time  8    Period  Weeks    Status  New    Target Date  06/25/19      PT LONG TERM GOAL #2   Title  Patient will exhibit gross hip strength of >/= 4/5 MMT bilaterally and knee strength of >/= 5/5 MMT bilaterally to improve walking and stair negotiation    Time  8    Period  Weeks    Status  New    Target Date  06/25/19      PT LONG TERM GOAL #3   Title  Patient will demonstrate improve single leg stance to >/=  20 sec bilaterally to improve balance and reduce fall risk    Time  8    Period  Weeks    Status  New    Target Date  06/25/19      PT LONG TERM GOAL #4   Title  Patient will report </= 3/10 pain level with walking community level distances    Time  8    Period  Weeks    Status  New    Target Date  06/25/19      PT LONG TERM GOAL #5   Title  Patient will be able to perform heavy household tasks with little to no difficulty    Time  8    Period  Weeks    Status  New    Target Date  06/25/19            Plan - 06/12/19 1000    Clinical Impression Statement  Patient tolerated therapy well with no adverse effects. She did report a fall since last visit  due to her left knee giving out on her but she did not injure herself. She did report improvement in her symptoms following therapy and tolerated strengthening exercises well. She does continues to exhibit difficulty with step-ups more on left and greater strength deficit on left. She would benefit from continued skilled PT to further progress her core and hip/LE strength to reduce lower back pain and improve ambulatory ability to reduce fall risk.    PT Treatment/Interventions  ADLs/Self Care Home Management;Cryotherapy;Electrical Stimulation;Moist Heat;Gait training;Stair training;Functional mobility training;Therapeutic activities;Therapeutic exercise;Balance training;Neuromuscular re-education;Patient/family education;Manual techniques;Dry needling;Passive range of motion;Spinal Manipulations;Joint Manipulations    PT Next Visit Plan  Assess HEP and progress PRN, general hip and BLE strengthening, progress step-ups    PT Home Exercise Plan  [email or web address]: supine LTR, piriformis stretch, brigde (partial range), side clamshell with yellow, seated LAQ, SLR, standing hamstring curl, sit<>stand, forward step-up    Consulted and Agree with Plan of Care  Patient       Patient will benefit from skilled therapeutic intervention in order to improve the following deficits and impairments:  Abnormal gait, Decreased range of motion, Decreased activity tolerance, Pain, Decreased balance, Improper body mechanics, Postural dysfunction, Decreased strength, Decreased endurance  Visit Diagnosis: Chronic bilateral low back pain, unspecified whether sciatica present  Muscle weakness (generalized)  Other abnormalities of gait and mobility  Repeated falls     Problem List There are no problems to display for this patient.   Rosana Hoes, PT, DPT, LAT, ATC 06/12/19  10:49 AM Phone: [phone] Fax: [phone]   [facility] Outpatient Rehabilitation [facility] [address] [place],  [place], [address] Phone: [phone]   Fax:  [phone]  Name: Morgan Jacobson MRN: [id number] Date of Birth: January 26, 1965

## 2019-06-16 ENCOUNTER — Encounter: Payer: Self-pay | Admitting: Physical Therapy

## 2019-06-16 ENCOUNTER — Other Ambulatory Visit: Payer: Self-pay

## 2019-06-16 ENCOUNTER — Ambulatory Visit: Payer: Medicaid Other | Admitting: Physical Therapy

## 2019-06-16 DIAGNOSIS — M6281 Muscle weakness (generalized): Secondary | ICD-10-CM

## 2019-06-16 DIAGNOSIS — R296 Repeated falls: Secondary | ICD-10-CM

## 2019-06-16 DIAGNOSIS — M545 Low back pain: Secondary | ICD-10-CM | POA: Diagnosis not present

## 2019-06-16 DIAGNOSIS — R2689 Other abnormalities of gait and mobility: Secondary | ICD-10-CM

## 2019-06-16 DIAGNOSIS — G8929 Other chronic pain: Secondary | ICD-10-CM

## 2019-06-16 NOTE — Therapy (Signed)
[facility] [address] [place], [place], [address] Phone: [phone]   Fax:  [phone]  Physical Therapy Treatment  Patient Details  Name: Morgan Jacobson MRN: [id number] Date of Birth: 02/08/1965 Referring Provider (PT): Pati Gallo, MD   Encounter Date: 06/16/2019  PT End of Session - 06/16/19 1002    Visit Number  6    Number of Visits  8    Date for PT Re-Evaluation  06/25/19    Authorization Type  MCD    Authorization Time Period  06/09/2019 - 08/03/2019    Authorization - Visit Number  2    Authorization - Number of Visits  8    PT Start Time  1001    PT Stop Time  1042    PT Time Calculation (min)  41 min    Activity Tolerance  Patient tolerated treatment well    Behavior During Therapy  [facility] for tasks assessed/performed       History reviewed. No pertinent past medical history.  History reviewed. No pertinent surgical history.  There were no vitals filed for this visit.  Subjective Assessment - 06/16/19 1001    Subjective  Patient reports when she stands for a while her left leg feels really weak.    Patient Stated Goals  Get pain better and legs stronger to improve walking ability and reduce falls    Currently in Pain?  Yes    Pain Score  1     Pain Location  Back    Pain Orientation  Lower    Pain Descriptors / Indicators  Aching;Tightness    Pain Type  Chronic pain    Pain Onset  More than a month ago    Pain Frequency  Intermittent    Aggravating Factors   Standing         [facility] PT Assessment - 06/16/19 0001      Strength   Right Hip Extension  4-/5    Right Hip ABduction  3+/5    Left Hip Extension  4-/5    Left Hip ABduction  3+/5                    [facility] Adult PT Treatment/Exercise - 06/16/19 0001      Exercises   Exercises  Lumbar      Lumbar Exercises: Stretches   Passive Hamstring Stretch  20 seconds    Single Knee to Chest Stretch  20 seconds    Lower  Trunk Rotation  5 reps;10 seconds    Lower Trunk Rotation Limitations  figure-4 position    Hip Flexor Stretch  2 reps;20 seconds    Hip Flexor Stretch Limitations  supine edge of table    Piriformis Stretch  2 reps;20 seconds    Figure 4 Stretch  20 seconds      Lumbar Exercises: Aerobic   Nustep  L5 x 6 min (LE and UE)      Lumbar Exercises: Standing   Heel Raises  10 reps   2 sets   Other Standing Lumbar Exercises  Forward step-up on 6" step 2x10 each    Other Standing Lumbar Exercises  Hip abduction, extension, marching, hamstring curl with 2# 2x10 each      Lumbar Exercises: Seated   Sit to Stand  10 reps   2 sets   Sit to Stand Limitations  no UE support, table slightly elevated      Lumbar Exercises: Supine  Bridge  10 reps   2 sets            PT Education - 06/16/19 1000    Education Details  HEP    Person(s) Educated  Patient    Methods  Explanation;Demonstration;Verbal cues    Comprehension  Verbalized understanding;Returned demonstration;Verbal cues required;Need further instruction       PT Short Term Goals - 06/02/19 0951      PT SHORT TERM GOAL #1   Title  Patient will be I with initial HEP to progress in therapy    Baseline  Patient continues to require cueing for exercises    Time  4    Period  Weeks    Status  On-going    Target Date  05/28/19      PT SHORT TERM GOAL #2   Title  Patient will be able to negotiate 1 flight of stairs with little to no difficulty    Baseline  Patient continues to report a lot of difficulty    Time  4    Period  Weeks    Status  On-going    Target Date  05/28/19      PT SHORT TERM GOAL #3   Title  Patient will be able to walk >/= 15 minutes without rest break to improve ability to grocery shop and access community    Baseline  Patient reports 20-30 minutes    Time  4    Period  Weeks    Status  Achieved    Target Date  05/28/19      PT SHORT TERM GOAL #4   Title  Patient will report </= 5/10 pain level  with walking to improve functional mobility    Baseline  Patient reports 4/10 pain level    Time  4    Period  Weeks    Status  Achieved    Target Date  05/28/19      PT SHORT TERM GOAL #5   Title  Patient will be able to perform light household tasks with little to no difficulty    Baseline  Patient reports continued moderate difficulty    Time  4    Period  Weeks    Status  On-going    Target Date  05/28/19        PT Long Term Goals - 04/30/19 1356      PT LONG TERM GOAL #1   Title  Patient will be I with final HEP to maintain progress from PT    Time  8    Period  Weeks    Status  New    Target Date  06/25/19      PT LONG TERM GOAL #2   Title  Patient will exhibit gross hip strength of >/= 4/5 MMT bilaterally and knee strength of >/= 5/5 MMT bilaterally to improve walking and stair negotiation    Time  8    Period  Weeks    Status  New    Target Date  06/25/19      PT LONG TERM GOAL #3   Title  Patient will demonstrate improve single leg stance to >/= 20 sec bilaterally to improve balance and reduce fall risk    Time  8    Period  Weeks    Status  New    Target Date  06/25/19      PT LONG TERM GOAL #4   Title  Patient will report </= 3/10  pain level with walking community level distances    Time  8    Period  Weeks    Status  New    Target Date  06/25/19      PT LONG TERM GOAL #5   Title  Patient will be able to perform heavy household tasks with little to no difficulty    Time  8    Period  Weeks    Status  New    Target Date  06/25/19            Plan - 06/16/19 1003    Clinical Impression Statement  Patient tolerated therapy well with no adverse effects. She was able to tolerate progressions in standing strengthening this visit. She does continue to exhibit bilateral hip and gross LE weakness. She continues to report improvement following therapy and states she feels stronger when she is doing the standing exercises. HEP was progressed this visit.  She would benefit from continued skilled PT to further progress her core and hip/LE strength to reduce lower back pain and improve ambulatory ability to reduce fall risk.    PT Treatment/Interventions  ADLs/Self Care Home Management;Cryotherapy;Electrical Stimulation;Moist Heat;Gait training;Stair training;Functional mobility training;Therapeutic activities;Therapeutic exercise;Balance training;Neuromuscular re-education;Patient/family education;Manual techniques;Dry needling;Passive range of motion;Spinal Manipulations;Joint Manipulations    PT Next Visit Plan  Assess HEP and progress PRN, general hip and BLE strengthening, progress step-ups    PT Home Exercise Plan  [email or web address]: supine LTR, piriformis stretch, seated LAQ, standing hip abduction, extension, marching, hamstring curl, heel raises, sit<>stand, forward step-up    Consulted and Agree with Plan of Care  Patient       Patient will benefit from skilled therapeutic intervention in order to improve the following deficits and impairments:  Abnormal gait, Decreased range of motion, Decreased activity tolerance, Pain, Decreased balance, Improper body mechanics, Postural dysfunction, Decreased strength, Decreased endurance  Visit Diagnosis: Chronic bilateral low back pain, unspecified whether sciatica present  Muscle weakness (generalized)  Other abnormalities of gait and mobility  Repeated falls     Problem List There are no problems to display for this patient.   Rosana Hoes, PT, DPT, LAT, ATC 06/16/19  10:42 AM Phone: [phone] Fax: [phone]   [facility] [address] [place], [place], [address] Phone: [phone]   Fax:  [phone]  Name: Morgan Jacobson MRN: [id number] Date of Birth: 1965/12/24

## 2019-06-16 NOTE — Patient Instructions (Signed)
Access Code: [email or web address] URL: [email or web address] Date: 05/13/2019 Prepared by: Rosana Hoes  Exercises Supine Lower Trunk Rotation - 2-3 x daily - 7 x weekly - 10 reps - 5 seconds hold Supine Piriformis Stretch with Foot on Ground - 2-3 x daily - 7 x weekly - 2 reps - 20 seconds hold Seated Long Arc Quad - 1-2 x daily - 7 x weekly - 2 sets - 15 reps Standing Hip Abduction with Counter Support - 1-2 x daily - 7 x weekly - 2 sets - 10 reps Standing Hip Extension with Counter Support - 1-2 x daily - 7 x weekly - 2 sets - 10 reps Standing March with Counter Support - 1-2 x daily - 7 x weekly - 2 sets - 10 reps Standing Knee Flexion AROM with Chair Support - 1-2 x daily - 7 x weekly - 2 sets - 10 reps Heel rises with counter support - 1-2 x daily - 7 x weekly - 2 sets - 10 reps Sit to Stand with Hands on Knees - 1-2 x daily - 7 x weekly - 10 reps - 2 sets Step Up - 1-2 x daily - 7 x weekly - 2 sets - 10 reps

## 2019-06-24 ENCOUNTER — Ambulatory Visit: Payer: Medicaid Other | Admitting: Physical Therapy

## 2019-07-02 ENCOUNTER — Ambulatory Visit: Payer: Medicaid Other | Attending: Sports Medicine | Admitting: Physical Therapy

## 2019-07-02 ENCOUNTER — Telehealth: Payer: Self-pay | Admitting: Physical Therapy

## 2019-07-02 DIAGNOSIS — R2689 Other abnormalities of gait and mobility: Secondary | ICD-10-CM | POA: Insufficient documentation

## 2019-07-02 DIAGNOSIS — R296 Repeated falls: Secondary | ICD-10-CM | POA: Insufficient documentation

## 2019-07-02 DIAGNOSIS — G8929 Other chronic pain: Secondary | ICD-10-CM | POA: Insufficient documentation

## 2019-07-02 DIAGNOSIS — M545 Low back pain: Secondary | ICD-10-CM | POA: Insufficient documentation

## 2019-07-02 DIAGNOSIS — M6281 Muscle weakness (generalized): Secondary | ICD-10-CM | POA: Insufficient documentation

## 2019-07-02 NOTE — Telephone Encounter (Signed)
Patient contacted due to missing PT appointment today, she reports that she forgot about the appointment. Patient was reminded of attendance policy for no-shows and that her next scheduled appointment was on 07/09/2019. Patient expressed understanding and stated she would be at the next appointment.

## 2019-07-09 ENCOUNTER — Other Ambulatory Visit: Payer: Self-pay

## 2019-07-09 ENCOUNTER — Encounter: Payer: Self-pay | Admitting: Physical Therapy

## 2019-07-09 ENCOUNTER — Ambulatory Visit: Payer: Medicaid Other | Admitting: Physical Therapy

## 2019-07-09 DIAGNOSIS — M545 Low back pain, unspecified: Secondary | ICD-10-CM

## 2019-07-09 DIAGNOSIS — G8929 Other chronic pain: Secondary | ICD-10-CM | POA: Diagnosis present

## 2019-07-09 DIAGNOSIS — R2689 Other abnormalities of gait and mobility: Secondary | ICD-10-CM

## 2019-07-09 DIAGNOSIS — R296 Repeated falls: Secondary | ICD-10-CM

## 2019-07-09 DIAGNOSIS — M6281 Muscle weakness (generalized): Secondary | ICD-10-CM | POA: Diagnosis present

## 2019-07-09 NOTE — Therapy (Signed)
[place], [place], [address] Phone: [phone]   Fax:  [phone]  Physical Therapy Treatment   Progress Note Reporting Period 04/30/2019 to 07/09/2019  See note below for Objective Data and Assessment of Progress/Goals.    Patient Details  Name: Morgan Jacobson MRN: [id number] Date of Birth: [birth year] Referring Provider (PT): Berle Mull, MD   Encounter Date: 07/09/2019   PT End of Session - 07/09/19 1034    Visit Number 7    Number of Visits 15    Date for PT Re-Evaluation 09/03/19    Authorization Type MCD    Authorization Time Period 06/09/2019 - 08/03/2019    Authorization - Visit Number 3    Authorization - Number of Visits 8    PT Start Time 5093    PT Stop Time 1125    PT Time Calculation (min) 42 min    Activity Tolerance Patient tolerated treatment well    Behavior During Therapy [facility] for tasks assessed/performed           History reviewed. No pertinent past medical history.  History reviewed. No pertinent surgical history.  There were no vitals filed for this visit.   Subjective Assessment - 07/09/19 1042    Subjective Patient reports she is doing well with no new issues. She does continue to have pain in the left leg that makes her feel like she is going to fall. She has not fallen since last visit.    Limitations Walking;Standing;House hold activities;Lifting    How long can you sit comfortably? No limitation    How long can you stand comfortably? 5-10 minutes    How long can you walk comfortably? 20-30 minutes    Patient Stated Goals Get pain better and legs stronger to improve walking ability and reduce falls    Currently in Pain? Yes    Pain Score 1     Pain Location Back    Pain Orientation Lower    Pain Descriptors / Indicators Aching;Tightness    Pain Type Chronic pain    Pain Onset More than a month ago    Pain Frequency Intermittent    Aggravating Factors   Standing, walking    Pain Relieving Factors Rest    Effect of Pain on Daily Activities Patient is limited with walking and standing tasks              [facility] PT Assessment - 07/09/19 0001      Assessment   Medical Diagnosis Chronic low back pain    Referring Provider (PT) Berle Mull, MD    Onset Date/Surgical Date 03/18/19   original onset 2011   Next MD Visit Not scheduled      Precautions   Precautions Fall      Restrictions   Weight Bearing Restrictions No      Balance Screen   How many times? No falls reports since last visit      Prior Function   Level of Independence Independent      Observation/Other Assessments   Observations Patient appears in no apparent distress    Focus on Therapeutic Outcomes (FOTO)  NA - MCD      Sensation   Light Touch Appears Intact      Single Leg Stance   Comments Left: 12 seconds, Right: 15 seconds      Sit to Stand   Comments Able to perform without UE support, good control  Strength   Right Hip Flexion 4/5    Right Hip Extension 4-/5    Right Hip ABduction 4-/5    Left Hip Flexion 4/5    Left Hip Extension 4-/5    Left Hip ABduction 4-/5    Right Knee Flexion 4+/5    Right Knee Extension 4+/5    Left Knee Flexion 4+/5    Left Knee Extension 4+/5      Ambulation/Gait   Ambulation/Gait Yes    Ambulation/Gait Assistance 6: Modified independent (Device/Increase time)    Assistive device Straight cane                         [facility] Adult PT Treatment/Exercise - 07/09/19 0001      Exercises   Exercises Lumbar      Lumbar Exercises: Stretches   Passive Hamstring Stretch 20 seconds    Lower Trunk Rotation 5 reps;10 seconds    Piriformis Stretch 20 seconds    Piriformis Stretch Limitations seated    Figure 4 Stretch 20 seconds    Figure 4 Stretch Limitations seated      Lumbar Exercises: Aerobic   Nustep L5 x 5 min (LE and UE)      Lumbar Exercises: Standing   Other Standing Lumbar Exercises  Forward step-up on 6" step 2x10 each    Other Standing Lumbar Exercises SL stance at counter 3x20 sec      Lumbar Exercises: Seated   Sit to Stand 10 reps   2 sets   Sit to Stand Limitations no UE support, standard chair height      Lumbar Exercises: Supine   Bridge 10 reps;2 seconds   2 sets   Straight Leg Raise 10 reps   2 sets                 PT Education - 07/09/19 1034    Education Details HEP, balance exercises    Person(s) Educated Patient    Methods Explanation;Demonstration;Verbal cues    Comprehension Verbalized understanding;Returned demonstration;Verbal cues required;Need further instruction            PT Short Term Goals - 07/09/19 1049      PT SHORT TERM GOAL #1   Title Patient will be I with initial HEP to progress in therapy    Baseline Patient demonstrates proper form    Time 4    Period Weeks    Status Achieved    Target Date 05/28/19      PT SHORT TERM GOAL #2   Title Patient will be able to negotiate 1 flight of stairs with little to no difficulty    Baseline Patient reports a little difficulty as long as she has a railing    Time 4    Period Weeks    Status Achieved    Target Date 05/28/19      PT SHORT TERM GOAL #3   Title Patient will be able to walk >/= 15 minutes without rest break to improve ability to grocery shop and access community    Baseline Patient reports 20-30 minutes    Time 4    Period Weeks    Status Achieved    Target Date 05/28/19      PT SHORT TERM GOAL #4   Title Patient will report </= 5/10 pain level with walking to improve functional mobility    Baseline Patient reports 3/10 pain level with walking shorter distances    Time 4  Period Weeks    Status Achieved    Target Date 05/28/19      PT SHORT TERM GOAL #5   Title Patient will be able to perform light household tasks with little to no difficulty    Baseline Patient reports a little difficulty with light tasks    Time 4    Period Weeks    Status  Achieved    Target Date 05/28/19             PT Long Term Goals - 07/09/19 1050      PT LONG TERM GOAL #1   Title Patient will be I with final HEP to maintain progress from PT    Baseline Patient continues to require cueing for advanced HEP    Time 8    Period Weeks    Status On-going    Target Date 09/03/19      PT LONG TERM GOAL #2   Title Patient will exhibit gross hip strength of >/= 4/5 MMT bilaterally and knee strength of >/= 5/5 MMT bilaterally to improve walking and stair negotiation    Baseline Knee strength gross 4+/5 MMT, hip strength grossly 4-/5 MMT    Time 8    Period Weeks    Status On-going    Target Date 09/03/19      PT LONG TERM GOAL #3   Title Patient will demonstrate improve single leg stance to >/= 20 sec bilaterally to improve balance and reduce fall risk    Baseline Left: 12 seconds, Right: 15 seconds    Time 8    Period Weeks    Status On-going    Target Date 09/03/19      PT LONG TERM GOAL #4   Title Patient will report </= 3/10 pain level with walking community level distances    Baseline Patient reports 7-8/10 pain when walking community level distances    Time 8    Period Weeks    Status On-going    Target Date 09/03/19      PT LONG TERM GOAL #5   Title Patient will be able to perform heavy household tasks with little to no difficulty    Baseline Patient reports a lot of difficulty performing heavy household tasks    Time 8    Period Weeks    Status On-going    Target Date 09/03/19                 Plan - 07/09/19 1041    Clinical Impression Statement Patient tolerated therapy well with no adverse effects. She does exhibit improvement in knee and hip strength and is progressing well with her balance. She continues to report increased pain with standing or walking extended periods and occasions where her left leg wants to give out on her. She continues to walk with a cane and was encouraged to do so as long as her left leg  continued to be weak. Her HEP was progressed and added balance with good tolerance. She would benefit from continued skilled PT to further progress her core and hip/LE strength to reduce lower back pain and improve ambulatory ability to reduce fall risk.    PT Frequency 1x / week    PT Duration 8 weeks    PT Treatment/Interventions ADLs/Self Care Home Management;Cryotherapy;Electrical Stimulation;Moist Heat;Gait training;Stair training;Functional mobility training;Therapeutic activities;Therapeutic exercise;Balance training;Neuromuscular re-education;Patient/family education;Manual techniques;Dry needling;Passive range of motion;Spinal Manipulations;Joint Manipulations    PT Next Visit Plan Assess HEP and progress PRN, general hip  and BLE strengthening, progress step-ups    PT Home Exercise Plan [email or web address]: supine LTR, seated piriformis and figure-4 stretch, SLR, bridge, seated LAQ, standing hip abduction, extension, marching, hamstring curl, heel raises, sit<>stand, forward step-up, SL balance    Consulted and Agree with Plan of Care Patient           Patient will benefit from skilled therapeutic intervention in order to improve the following deficits and impairments:  Abnormal gait, Decreased range of motion, Decreased activity tolerance, Pain, Decreased balance, Improper body mechanics, Postural dysfunction, Decreased strength, Decreased endurance  Visit Diagnosis: Chronic bilateral low back pain, unspecified whether sciatica present  Muscle weakness (generalized)  Other abnormalities of gait and mobility  Repeated falls     Problem List There are no problems to display for this patient.   Rosana Hoes, PT, DPT, LAT, ATC 07/09/19  11:29 AM Phone: [phone] Fax: [phone]   [facility] Outpatient Rehabilitation [facility] [address] [place], [place], [address] Phone: [phone]   Fax:  [phone]  Name: Morgan Jacobson MRN: [id number] Date  of Birth: [birth year]

## 2019-07-09 NOTE — Patient Instructions (Signed)
Access Code: [email or web address] URL: [email or web address] Date: 07/09/2019 Prepared by: Rosana Hoes  Exercises Supine Lower Trunk Rotation - 2 x daily - 7 x weekly - 10 reps - 5 seconds hold Seated Piriformis Stretch - 2 x daily - 7 x weekly - 2 reps - 20 seconds hold Seated Hip Stretch - 2 x daily - 7 x weekly - 2 reps - 20 seconds hold Supine Active Straight Leg Raise - 1 x daily - 4 x weekly - 2 sets - 10 reps Bridge - 1 x daily - 4 x weekly - 10 reps - 3 seconds hold Seated Long Arc Quad - 1 x daily - 4 x weekly - 2 sets - 15 reps Standing Hip Abduction with Counter Support - 1 x daily - 4 x weekly - 2 sets - 10 reps Standing Hip Extension with Counter Support - 1 x daily - 4 x weekly - 2 sets - 10 reps Standing March with Counter Support - 1 x daily - 4 x weekly - 2 sets - 10 reps Standing Knee Flexion AROM with Chair Support - 1 x daily - 4 x weekly - 2 sets - 10 reps Heel rises with counter support - 1 x daily - 4 x weekly - 2 sets - 10 reps Sit to Stand without Arm Support - 1 x daily - 4 x weekly - 2 sets - 10 reps Step Up - 1 x daily - 4 x weekly - 2 sets - 10 reps Standing Single Leg Stance with Counter Support - 1 x daily - 7 x weekly - 3 reps - 20 seconds hold

## 2019-07-16 ENCOUNTER — Other Ambulatory Visit: Payer: Self-pay

## 2019-07-16 ENCOUNTER — Encounter: Payer: Self-pay | Admitting: Physical Therapy

## 2019-07-16 ENCOUNTER — Ambulatory Visit: Payer: Medicaid Other | Admitting: Physical Therapy

## 2019-07-16 DIAGNOSIS — M6281 Muscle weakness (generalized): Secondary | ICD-10-CM

## 2019-07-16 DIAGNOSIS — R2689 Other abnormalities of gait and mobility: Secondary | ICD-10-CM

## 2019-07-16 DIAGNOSIS — M545 Low back pain, unspecified: Secondary | ICD-10-CM

## 2019-07-16 DIAGNOSIS — R296 Repeated falls: Secondary | ICD-10-CM

## 2019-07-16 NOTE — Therapy (Signed)
[facility]patient Rehabilitation [facility] [address] [place], [place], [address] Phone: [phone]   Fax:  [phone]  Physical Therapy Treatment  Patient Details  Name: Morgan Jacobson MRN: [id number] Date of Birth: [birth year] Referring Provider (PT): Pati Gallo, MD   Encounter Date: 07/16/2019   PT End of Session - 07/16/19 1053    Visit Number 8    Number of Visits 15    Date for PT Re-Evaluation 09/03/19    Authorization Type MCD    Authorization Time Period 06/09/2019 - 08/03/2019    Authorization - Visit Number 4    Authorization - Number of Visits 8    PT Start Time 1042    PT Stop Time 1125    PT Time Calculation (min) 43 min    Activity Tolerance Patient tolerated treatment well    Behavior During Therapy [facility] for tasks assessed/performed           History reviewed. No pertinent past medical history.  History reviewed. No pertinent surgical history.  There were no vitals filed for this visit.   Subjective Assessment - 07/16/19 1051    Subjective Patient reports she is doing good with no new issues. She denies any new falls.    Patient Stated Goals Get pain better and legs stronger to improve walking ability and reduce falls    Currently in Pain? No/denies              [facility] PT Assessment - 07/16/19 0001      Single Leg Stance   Comments Left: 15 seconds      Sit to Stand   Comments Able to perform without UE support, good control      Strength   Right Hip ABduction 4-/5    Left Hip ABduction 4-/5    Right Knee Extension 4+/5    Left Knee Extension 4+/5                         [facility] Adult PT Treatment/Exercise - 07/16/19 0001      Exercises   Exercises Lumbar      Lumbar Exercises: Stretches   Passive Hamstring Stretch 30 seconds    Single Knee to Chest Stretch 30 seconds    Lower Trunk Rotation 3 reps;10 seconds    Piriformis Stretch 30 seconds    Figure 4 Stretch 30 seconds    Gastroc  Stretch 30 seconds      Lumbar Exercises: Aerobic   Nustep L5 x 5 min (LE only)      Lumbar Exercises: Standing   Heel Raises 10 reps   2 sets   Other Standing Lumbar Exercises Forward step-up on 8" step 2x10 each - single UE support    Other Standing Lumbar Exercises SL stance at counter 3x20 sec      Lumbar Exercises: Seated   Sit to Stand 10 reps   2 sets   Sit to Stand Limitations no UE support, standard chair height      Lumbar Exercises: Supine   Bridge 10 reps;3 seconds   2 sets   Straight Leg Raise 10 reps   2 sets     Lumbar Exercises: Sidelying   Clam 15 reps   2 sets                 PT Education - 07/16/19 1052    Education Details HEP, balance exercises    Person(s) Educated Patient  Methods Explanation;Demonstration;Verbal cues    Comprehension Verbalized understanding;Returned demonstration;Verbal cues required;Need further instruction            PT Short Term Goals - 07/09/19 1049      PT SHORT TERM GOAL #1   Title Patient will be I with initial HEP to progress in therapy    Baseline Patient demonstrates proper form    Time 4    Period Weeks    Status Achieved    Target Date 05/28/19      PT SHORT TERM GOAL #2   Title Patient will be able to negotiate 1 flight of stairs with little to no difficulty    Baseline Patient reports a little difficulty as long as she has a railing    Time 4    Period Weeks    Status Achieved    Target Date 05/28/19      PT SHORT TERM GOAL #3   Title Patient will be able to walk >/= 15 minutes without rest break to improve ability to grocery shop and access community    Baseline Patient reports 20-30 minutes    Time 4    Period Weeks    Status Achieved    Target Date 05/28/19      PT SHORT TERM GOAL #4   Title Patient will report </= 5/10 pain level with walking to improve functional mobility    Baseline Patient reports 3/10 pain level with walking shorter distances    Time 4    Period Weeks    Status  Achieved    Target Date 05/28/19      PT SHORT TERM GOAL #5   Title Patient will be able to perform light household tasks with little to no difficulty    Baseline Patient reports a little difficulty with light tasks    Time 4    Period Weeks    Status Achieved    Target Date 05/28/19             PT Long Term Goals - 07/09/19 1050      PT LONG TERM GOAL #1   Title Patient will be I with final HEP to maintain progress from PT    Baseline Patient continues to require cueing for advanced HEP    Time 8    Period Weeks    Status On-going    Target Date 09/03/19      PT LONG TERM GOAL #2   Title Patient will exhibit gross hip strength of >/= 4/5 MMT bilaterally and knee strength of >/= 5/5 MMT bilaterally to improve walking and stair negotiation    Baseline Knee strength gross 4+/5 MMT, hip strength grossly 4-/5 MMT    Time 8    Period Weeks    Status On-going    Target Date 09/03/19      PT LONG TERM GOAL #3   Title Patient will demonstrate improve single leg stance to >/= 20 sec bilaterally to improve balance and reduce fall risk    Baseline Left: 12 seconds, Right: 15 seconds    Time 8    Period Weeks    Status On-going    Target Date 09/03/19      PT LONG TERM GOAL #4   Title Patient will report </= 3/10 pain level with walking community level distances    Baseline Patient reports 7-8/10 pain when walking community level distances    Time 8    Period Weeks    Status On-going  Target Date 09/03/19      PT LONG TERM GOAL #5   Title Patient will be able to perform heavy household tasks with little to no difficulty    Baseline Patient reports a lot of difficulty performing heavy household tasks    Time 8    Period Weeks    Status On-going    Target Date 09/03/19                 Plan - 07/16/19 1053    Clinical Impression Statement Patient tolerated therapy well with no adverse effects. She did not report any additional pain with therapy this visit.  Continued progressing strength and balance with good tolerance, and she demonstrated improved single leg balance with visit. She does continues to exhibit strength deficits of BLEs but she states improved walking stability still using cane. She would benefit from continued skilled PT to further progress her core and hip/LE strength to improve ambulatory ability to reduce fall risk.    PT Treatment/Interventions ADLs/Self Care Home Management;Cryotherapy;Electrical Stimulation;Moist Heat;Gait training;Stair training;Functional mobility training;Therapeutic activities;Therapeutic exercise;Balance training;Neuromuscular re-education;Patient/family education;Manual techniques;Dry needling;Passive range of motion;Spinal Manipulations;Joint Manipulations    PT Next Visit Plan Assess HEP and progress PRN, general hip and BLE strengthening, progress step-ups, balance    PT Home Exercise Plan [email or web address]: supine LTR, seated piriformis and figure-4 stretch, SLR, bridge, seated LAQ, standing hip abduction, extension, marching, hamstring curl, heel raises, sit<>stand, forward step-up, SL balance    Consulted and Agree with Plan of Care Patient           Patient will benefit from skilled therapeutic intervention in order to improve the following deficits and impairments:  Abnormal gait, Decreased range of motion, Decreased activity tolerance, Pain, Decreased balance, Improper body mechanics, Postural dysfunction, Decreased strength, Decreased endurance  Visit Diagnosis: Chronic bilateral low back pain, unspecified whether sciatica present  Muscle weakness (generalized)  Other abnormalities of gait and mobility  Repeated falls     Problem List There are no problems to display for this patient.   Hilda Blades, PT, DPT, LAT, ATC 07/16/19  11:26 AM Phone: [phone] Fax: [facility] [address] [place], [place], [address] Phone:  [phone]   Fax:  [phone]  Name: Morgan Jacobson MRN: [id number] Date of Birth: [birth year]

## 2019-07-23 ENCOUNTER — Ambulatory Visit: Payer: Medicaid Other | Admitting: Physical Therapy

## 2019-07-30 ENCOUNTER — Encounter: Payer: Self-pay | Admitting: Physical Therapy

## 2019-07-30 ENCOUNTER — Ambulatory Visit: Payer: Medicaid Other | Attending: Sports Medicine | Admitting: Physical Therapy

## 2019-07-30 ENCOUNTER — Other Ambulatory Visit: Payer: Self-pay

## 2019-07-30 DIAGNOSIS — R296 Repeated falls: Secondary | ICD-10-CM | POA: Diagnosis present

## 2019-07-30 DIAGNOSIS — G8929 Other chronic pain: Secondary | ICD-10-CM | POA: Insufficient documentation

## 2019-07-30 DIAGNOSIS — M545 Low back pain, unspecified: Secondary | ICD-10-CM

## 2019-07-30 DIAGNOSIS — R2689 Other abnormalities of gait and mobility: Secondary | ICD-10-CM

## 2019-07-30 DIAGNOSIS — M6281 Muscle weakness (generalized): Secondary | ICD-10-CM | POA: Insufficient documentation

## 2019-07-30 NOTE — Therapy (Signed)
[facility] [address] [place], [place], [address] Phone: [phone]   Fax:  [phone]  Physical Therapy Treatment / MCD ERO  Patient Details  Name: Morgan DoppVonzella Jacobson Jacobson MRN: [id number] Date of Birth: Jun 03, 1965 Referring Provider (PT): Pati GalloKramer, James, MD   Encounter Date: 07/30/2019   PT End of Session - 07/30/19 1032    Visit Number 9    Number of Visits 15    Date for PT Re-Evaluation 09/03/19    Authorization Type MCD    Authorization Time Period 06/09/2019 - 08/03/2019    Authorization - Visit Number 5    Authorization - Number of Visits 8    PT Start Time 1030    PT Stop Time 1112    PT Time Calculation (min) 42 min    Activity Tolerance Patient tolerated treatment well    Behavior [facility] for tasks assessed/performed           History reviewed. No pertinent past medical history.  History reviewed. No pertinent surgical history.  There were no vitals filed for this visit.   Subjective Assessment - 07/30/19 1041    Subjective Patient reports she is doing well and feel she is improving. She missed last appointment due to her sister going to the hospital. She does continue to feel like her legs are week with activity and continues to have occasional back pain when walking longer distances. She notes stiffness in the mornings.    Patient Stated Goals Get pain better and legs stronger to improve walking ability and reduce falls    Currently in Pain? No/denies              [facility] - 07/30/19 0001      Assessment   Medical Diagnosis Chronic low back pain    Referring Provider (PT) Pati GalloKramer, James, MD    Onset Date/Surgical Date 03/18/19    Next MD Visit Not scheduled      Precautions   Precautions Fall    Precaution Comments Patient uses cane due to frequent falls      Restrictions   Weight Bearing Restrictions No      Balance Screen   Has the patient fallen in the past 6 months  Yes    How many times? Patient has not fallen sine last visit    Has the patient had a decrease in activity level because of a fear of falling?  No    Is the patient reluctant to leave their home because of a fear of falling?  No      Prior Function   Level of Independence Independent    Vocation On disability    Leisure Walking      Cognition   Overall Cognitive Status Within Functional Limits for tasks assessed      Observation/Other Assessments   Observations Patient appears in no apparent distress    Focus on Therapeutic Outcomes (FOTO)  NA - MCD      Sensation   Light Touch Appears Intact      Sit to Stand   Comments Able to perform without UE support, good control      AROM   Overall AROM Comments Patient exhibits lumbar AROM grossly WFL      PROM   Overall PROM Comments Hip PROM grossly WFL bilaterally and non-painful      Strength   Right Hip Flexion 4+/5    Right Hip Extension 4-/5    Right Hip  ABduction 4-/5    Left Hip Flexion 4/5    Left Hip Extension 4-/5    Left Hip ABduction 4-/5    Right Knee Flexion 4+/5    Right Knee Extension 5/5    Left Knee Flexion 4/5    Left Knee Extension 5/5    Right Ankle Dorsiflexion 5/5    Right Ankle Plantar Flexion 4/5    Left Ankle Dorsiflexion 5/5    Left Ankle Plantar Flexion 4/5      Flexibility   Hamstrings WFL - patient reports L>R tightness, no radicular symptoms    Piriformis Limited bilaterally - increased tightness on the left       Palpation   Palpation comment Non-TTP      Slump test   Findings Negative      Straight Leg Raise   Findings Negative      Transfers   Transfers Independent with all Transfers      Ambulation/Gait   Ambulation/Gait Yes    Ambulation/Gait Assistance 6: Modified independent (Device/Increase time)    Assistive device Straight cane                         [facility] Adult PT Treatment/Exercise - 07/30/19 0001      Exercises   Exercises Lumbar      Lumbar  Exercises: Stretches   Passive Hamstring Stretch 30 seconds    Single Knee to Chest Stretch 30 seconds    Lower Trunk Rotation 3 reps;10 seconds    Piriformis Stretch 30 seconds    Figure 4 Stretch 30 seconds    Gastroc Stretch 30 seconds      Lumbar Exercises: Aerobic   Nustep L5 x 5 min (LE only)      Lumbar Exercises: Standing   Heel Raises 10 reps   2 sets   Other Standing Lumbar Exercises Forward step-up on 8" step 2x10 each - single UE support    Other Standing Lumbar Exercises SL stance at counter 2x20 sec; hip abduction, extension, marching, hamstring curl at counter 2x10 each      Lumbar Exercises: Seated   Sit to Stand 10 reps   2 sets   Sit to Stand Limitations no UE support, standard chair height      Lumbar Exercises: Supine   Bridge 10 reps;2 seconds   2 sets   Straight Leg Raise 10 reps   2 sets                 PT Education - 07/30/19 1031    Education Details HEP    Person(s) Educated Patient    Methods Explanation;Demonstration;Verbal cues    Comprehension Verbalized understanding;Returned demonstration;Verbal cues required;Need further instruction            PT Short Term Goals - 07/09/19 1049      PT SHORT TERM GOAL #1   Title Patient will be I with initial HEP to progress in therapy    Baseline Patient demonstrates proper form    Time 4    Period Weeks    Status Achieved    Target Date 05/28/19      PT SHORT TERM GOAL #2   Title Patient will be able to negotiate 1 flight of stairs with little to no difficulty    Baseline Patient reports a little difficulty as long as she has a railing    Time 4    Period Weeks    Status Achieved  Target Date 05/28/19      PT SHORT TERM GOAL #3   Title Patient will be able to walk >/= 15 minutes without rest break to improve ability to grocery shop and access community    Baseline Patient reports 20-30 minutes    Time 4    Period Weeks    Status Achieved    Target Date 05/28/19      PT SHORT  TERM GOAL #4   Title Patient will report </= 5/10 pain level with walking to improve functional mobility    Baseline Patient reports 3/10 pain level with walking shorter distances    Time 4    Period Weeks    Status Achieved    Target Date 05/28/19      PT SHORT TERM GOAL #5   Title Patient will be able to perform light household tasks with little to no difficulty    Baseline Patient reports a little difficulty with light tasks    Time 4    Period Weeks    Status Achieved    Target Date 05/28/19             PT Long Term Goals - 07/30/19 1119      PT LONG TERM GOAL #1   Title Patient will be I with final HEP to maintain progress from PT    Baseline Patient continues to require cueing for advanced HEP    Time 8    Period Weeks    Status On-going    Target Date 09/03/19      PT LONG TERM GOAL #2   Title Patient will exhibit gross hip strength of >/= 4/5 MMT bilaterally and knee strength of >/= 5/5 MMT bilaterally to improve walking and stair negotiation    Baseline Patient continues to demonstrate gross hip strength deficits    Time 8    Period Weeks    Status On-going    Target Date 09/03/19      PT LONG TERM GOAL #3   Title Patient will demonstrate improve single leg stance to >/= 20 sec bilaterally to improve balance and reduce fall risk    Baseline Left: 13 seconds, Right: 15 seconds    Time 8    Period Weeks    Status On-going    Target Date 09/03/19      PT LONG TERM GOAL #4   Title Patient will report </= 3/10 pain level with walking community level distances    Baseline Patient reports 5/10 pain when walking community level distances    Time 8    Period Weeks    Status On-going    Target Date 09/03/19      PT LONG TERM GOAL #5   Title Patient will be able to perform heavy household tasks with little to no difficulty    Baseline Patient reports moderate to a lot of difficulty performing heavy household tasks    Time 8    Period Weeks    Status On-going     Target Date 09/03/19                 Plan - 07/30/19 1042    Clinical Impression Statement Patient tolerated therapy well with no adverse effects. She does demonstrate improvement in strength and balance but continues to be limited with activity due to increase in pain and feeling of leg weakness with walking or household activity for extended periods, which is why she continues to use the cane. Her HEP  was progressed this visit to continue progressing strength and included more stretching she can perform in the morning to reduce stiffness. She would benefit from continued skilled PT to further progress her strength and balance to improve ambulatory ability to reduce fall risk.    PT Frequency 1x / week    PT Duration 8 weeks    PT Treatment/Interventions ADLs/Self Care Home Management;Cryotherapy;Electrical Stimulation;Moist Heat;Gait training;Stair training;Functional mobility training;Therapeutic activities;Therapeutic exercise;Balance training;Neuromuscular re-education;Patient/family education;Manual techniques;Dry needling;Passive range of motion;Spinal Manipulations;Joint Manipulations    PT Next Visit Plan Assess HEP and progress PRN, general hip and BLE strengthening, progress step-ups, balance    PT Home Exercise Plan [email or web address]: supine LTR, SKTC, seated piriformis and figure-4 stretch, seated hamstring stretch, SLR, bridge, seated LAQ, standing hip abduction, extension, marching, hamstring curl, heel raises, sit<>stand, forward step-up, SL balance    Consulted and Agree with Plan of Care Patient           Patient will benefit from skilled therapeutic intervention in order to improve the following deficits and impairments:  Abnormal gait, Decreased range of motion, Decreased activity tolerance, Pain, Decreased balance, Improper body mechanics, Postural dysfunction, Decreased strength, Decreased endurance  Visit Diagnosis: Chronic bilateral low back pain, unspecified whether  sciatica present  Muscle weakness (generalized)  Other abnormalities of gait and mobility  Repeated falls     Problem List There are no problems to display for this patient.   Rosana Hoes, PT, DPT, LAT, ATC 07/30/19  11:27 AM Phone: [phone] Fax: [phone]   [facility] Outpatient Rehabilitation [facility] [address] [place], [place], [address] Phone: [phone]   Fax:  [phone]  Name: Morgan Jacobson MRN: [id number] Date of Birth: Apr 09, 1965

## 2019-07-30 NOTE — Patient Instructions (Signed)
Access Code: [email or web address] URL: [email or web address] Date: 07/30/2019 Prepared by: Rosana Hoes  Exercises Supine Lower Trunk Rotation - 2 x daily - 7 x weekly - 5 reps - 10 seconds hold Supine Single Knee to Chest - 2 x daily - 7 x weekly - 30 seconds hold Seated Piriformis Stretch - 2 x daily - 7 x weekly - 30 seconds hold Seated Hip Stretch - 2 x daily - 7 x weekly - 30 seconds hold Seated Hamstring Stretch - 2 x daily - 7 x weekly - 30 seconds hold Supine Active Straight Leg Raise - 1 x daily - 4 x weekly - 2 sets - 10 reps Bridge - 1 x daily - 4 x weekly - 10 reps - 1-2 seconds hold - 2 sets Seated Long Arc Quad - 1 x daily - 4 x weekly - 2 sets - 15 reps Standing Hip Abduction with Counter Support - 1 x daily - 4 x weekly - 2 sets - 10 reps Standing Hip Extension with Counter Support - 1 x daily - 4 x weekly - 2 sets - 10 reps Standing March with Counter Support - 1 x daily - 4 x weekly - 2 sets - 10 reps Standing Knee Flexion AROM with Chair Support - 1 x daily - 4 x weekly - 2 sets - 10 reps Heel rises with counter support - 1 x daily - 4 x weekly - 2 sets - 10 reps Sit to Stand without Arm Support - 1 x daily - 4 x weekly - 2 sets - 10 reps Step Up - 1 x daily - 4 x weekly - 2 sets - 10 reps Standing Single Leg Stance with Counter Support - 1 x daily - 7 x weekly - 3 reps - 20 seconds hold

## 2019-08-15 ENCOUNTER — Ambulatory Visit: Payer: Medicaid Other | Admitting: Physical Therapy

## 2019-08-19 ENCOUNTER — Ambulatory Visit: Payer: Medicaid Other | Admitting: Physical Therapy

## 2019-08-26 ENCOUNTER — Other Ambulatory Visit: Payer: Self-pay

## 2019-08-26 ENCOUNTER — Encounter: Payer: Self-pay | Admitting: Physical Therapy

## 2019-08-26 ENCOUNTER — Ambulatory Visit: Payer: Medicaid Other | Attending: Sports Medicine | Admitting: Physical Therapy

## 2019-08-26 DIAGNOSIS — M545 Low back pain: Secondary | ICD-10-CM | POA: Insufficient documentation

## 2019-08-26 DIAGNOSIS — R296 Repeated falls: Secondary | ICD-10-CM | POA: Insufficient documentation

## 2019-08-26 DIAGNOSIS — M6281 Muscle weakness (generalized): Secondary | ICD-10-CM | POA: Insufficient documentation

## 2019-08-26 DIAGNOSIS — G8929 Other chronic pain: Secondary | ICD-10-CM

## 2019-08-26 DIAGNOSIS — R2689 Other abnormalities of gait and mobility: Secondary | ICD-10-CM | POA: Diagnosis present

## 2019-08-26 NOTE — Therapy (Addendum)
[facility] [place], [place], [address] Phone: [phone]   Fax:  [phone]  Physical Therapy Treatment  / Discharge  Patient Details  Name: Morgan Jacobson MRN: [id number] Date of Birth: [birth year] Referring Provider (PT): Berle Mull, MD   Encounter Date: 08/26/2019   PT End of Session - 08/26/19 1002    Visit Number 10    Number of Visits 15    Date for PT Re-Evaluation 09/03/19    Authorization Type [employer] MCD    Authorization - Visit Number --    Authorization - Number of Visits 27    Progress Note Due on Visit 17    PT Start Time 1000    PT Stop Time 1043    PT Time Calculation (min) 43 min    Activity Tolerance Patient tolerated treatment well    Behavior During Therapy [facility] for tasks assessed/performed           History reviewed. No pertinent past medical history.  History reviewed. No pertinent surgical history.  There were no vitals filed for this visit.   Subjective Assessment - 08/26/19 1008    Subjective Patient reports she is doing well. She states she has continued pain in the lower back and around the waist line, leg weakness and pain with walking extended periods. She has not fallen since last visit. She reports consistency with exercises and it sometimes feels like they are helping.    How long can you stand comfortably? 5-10 minutes    How long can you walk comfortably? 20-30 minutes    Patient Stated Goals Get pain better and legs stronger to improve walking ability and reduce falls    Currently in Pain? Yes    Pain Score 8     Pain Location Back    Pain Orientation Lower    Pain Descriptors / Indicators Aching;Tightness    Pain Type Chronic pain    Pain Onset More than a month ago    Pain Frequency Intermittent              [facility] PT Assessment - 08/26/19 0001      Assessment   Medical Diagnosis Chronic low back pain    Referring Provider (PT) Berle Mull, MD     Onset Date/Surgical Date 03/18/19    Next MD Visit 09/02/2019      Precautions   Precautions Fall    Precaution Comments Patient uses cane due to frequent falls      Restrictions   Weight Bearing Restrictions No      Balance Screen   Has the patient fallen in the past 6 months Yes      Observation/Other Assessments   Focus on Therapeutic Outcomes (FOTO)  NA - MCD      Sensation   Light Touch Appears Intact      Coordination   Gross Motor Movements are Fluid and Coordinated Yes      Single Leg Stance   Comments Left: 15 seconds      AROM   Overall AROM Comments Patient exhibits lumbar AROM grossly WFL - increased low back discomfort with extension      PROM   Overall PROM Comments Hip PROM grossly WFL bilaterally and non-painful      Strength   Right Hip Flexion 4/5    Right Hip Extension 4-/5    Right Hip ABduction 4-/5    Left Hip Flexion 4/5    Left Hip Extension 4-/5  Left Hip ABduction 4-/5    Right Knee Flexion 4+/5    Right Knee Extension 5/5    Left Knee Flexion 4/5    Left Knee Extension 5/5    Right Ankle Dorsiflexion 5/5    Right Ankle Plantar Flexion 4/5    Right Ankle Inversion 4+/5    Right Ankle Eversion 4+/5    Left Ankle Dorsiflexion 5/5    Left Ankle Plantar Flexion 4/5    Left Ankle Inversion 5/5    Left Ankle Eversion 4+/5      Slump test   Findings Negative      Straight Leg Raise   Findings Negative                         [facility] Adult PT Treatment/Exercise - 08/26/19 0001      Self-Care   Self-Care Other Self-Care Comments    Other Self-Care Comments  Continued HEP and follow-up      Exercises   Exercises Lumbar      Lumbar Exercises: Stretches   Passive Hamstring Stretch 30 seconds    Lower Trunk Rotation 3 reps;10 seconds    Lower Trunk Rotation Limitations figure-4 position    Hip Flexor Stretch 30 seconds    Hip Flexor Stretch Limitations supine edge of table    Piriformis Stretch 30 seconds    Figure 4  Stretch 30 seconds    Gastroc Stretch 30 seconds    Other Lumbar Stretch Exercise Seated lumbar flexion stretch with physioball 5x10 sec fwd and lateral      Lumbar Exercises: Aerobic   Nustep L5 x 5 min (LE only)      Lumbar Exercises: Seated   Sit to Stand 10 reps   2 sets     Lumbar Exercises: Supine   Bridge 10 reps;3 seconds    Straight Leg Raise 10 reps                  PT Education - 08/26/19 1001    Education Details HEP    Person(s) Educated Patient    Methods Explanation;Demonstration;Verbal cues    Comprehension Verbalized understanding;Returned demonstration;Verbal cues required;Need further instruction            PT Short Term Goals - 07/09/19 1049      PT SHORT TERM GOAL #1   Title Patient will be I with initial HEP to progress in therapy    Baseline Patient demonstrates proper form    Time 4    Period Weeks    Status Achieved    Target Date 05/28/19      PT SHORT TERM GOAL #2   Title Patient will be able to negotiate 1 flight of stairs with little to no difficulty    Baseline Patient reports a little difficulty as long as she has a railing    Time 4    Period Weeks    Status Achieved    Target Date 05/28/19      PT SHORT TERM GOAL #3   Title Patient will be able to walk >/= 15 minutes without rest break to improve ability to grocery shop and access community    Baseline Patient reports 20-30 minutes    Time 4    Period Weeks    Status Achieved    Target Date 05/28/19      PT SHORT TERM GOAL #4   Title Patient will report </= 5/10 pain level with walking to  improve functional mobility    Baseline Patient reports 3/10 pain level with walking shorter distances    Time 4    Period Weeks    Status Achieved    Target Date 05/28/19      PT SHORT TERM GOAL #5   Title Patient will be able to perform light household tasks with little to no difficulty    Baseline Patient reports a little difficulty with light tasks    Time 4    Period Weeks     Status Achieved    Target Date 05/28/19             PT Long Term Goals - 08/26/19 1026      PT LONG TERM GOAL #1   Title Patient will be I with final HEP to maintain progress from PT    Baseline Patient is independent with current HEP - 08/26/2019    Time 8    Period Weeks    Status Achieved      PT LONG TERM GOAL #2   Title Patient will exhibit gross hip strength of >/= 4/5 MMT bilaterally and knee strength of >/= 5/5 MMT bilaterally to improve walking and stair negotiation    Baseline Patient continues to demonstrate gross hip strength deficits - 08/26/2019    Time 8    Period Weeks    Status On-going    Target Date 09/03/19      PT LONG TERM GOAL #3   Title Patient will demonstrate improve single leg stance to >/= 20 sec bilaterally to improve balance and reduce fall risk    Baseline Left: 13 seconds, Right: 15 seconds - 08/26/2019    Time 8    Period Weeks    Status On-going    Target Date 09/03/19      PT LONG TERM GOAL #4   Title Patient will report </= 3/10 pain level with walking community level distances    Baseline Patient reports 8/10 pain level this visit - 08/26/2019    Time 8    Period Weeks    Status On-going    Target Date 09/03/19      PT LONG TERM GOAL #5   Title Patient will be able to perform heavy household tasks with little to no difficulty    Baseline Patient reports moderate to a lot of difficulty performing heavy household tasks - 08/26/2019    Time 8    Period Weeks    Status On-going    Target Date 09/03/19                 Plan - 08/26/19 1008    Clinical Impression Statement Patient tolerated therapy well with no adverse effects. She has a follow-up scheduled with her doctor on 09/02/2019 so patient will place PT on hold until she sees her doctor and determine whether she requires further assessment. She continues to report persistent lower back pain and leg weakness with activity. She denies any new falls since last visit. She doesn't  seem to have any radicular pain but it is unclear whether her leg weakness is related to her lower back or general deconditioning. She doesn't report any numbness/tingling or what seems to be nerve related pain. This visit reviewed HEP and stretching so patient can continue performing exercises at home until she determines future care plan with doctor. She did not report any increase in pain with therapy,    PT Treatment/Interventions ADLs/Self Care Home Management;Cryotherapy;Electrical Stimulation;Moist Heat;Gait training;Stair training;Functional  mobility training;Therapeutic activities;Therapeutic exercise;Balance training;Neuromuscular re-education;Patient/family education;Manual techniques;Dry needling;Passive range of motion;Spinal Manipulations;Joint Manipulations    PT Next Visit Plan Assess HEP and progress PRN, general core/hip and BLE strengthening, progress balance    PT Home Exercise Plan [email or web address]: supine LTR, SKTC, seated piriformis and figure-4 stretch, seated hamstring stretch, SLR, bridge, seated LAQ, standing hip abduction, extension, marching, hamstring curl, heel raises, sit<>stand, forward step-up, SL balance    Consulted and Agree with Plan of Care Patient           Patient will benefit from skilled therapeutic intervention in order to improve the following deficits and impairments:  Abnormal gait, Decreased range of motion, Decreased activity tolerance, Pain, Decreased balance, Improper body mechanics, Postural dysfunction, Decreased strength, Decreased endurance  Visit Diagnosis: Chronic bilateral low back pain, unspecified whether sciatica present  Muscle weakness (generalized)  Other abnormalities of gait and mobility  Repeated falls     Problem List There are no problems to display for this patient.   Hilda Blades, PT, DPT, LAT, ATC 08/26/19  10:43 AM Phone: [phone] Fax: [facility] [address] [place], [place], [address] Phone: [phone]   Fax:  [phone]  Name: Morgan Jacobson MRN: [id number] Date of Birth: January 22, 1966   PHYSICAL THERAPY DISCHARGE SUMMARY  Visits from Start of Care: 10  Current functional level related to goals / functional outcomes: See above   Remaining deficits: See above   Education / Equipment: HEP Plan: Patient agrees to discharge.  Patient goals were partially met. Patient is being discharged due to not returning since the last visit.  ?????   Hilda Blades, PT, DPT, LAT, ATC 10/30/19  3:24 PM Phone: [phone] Fax: [phone]

## 2019-09-02 ENCOUNTER — Encounter: Payer: Medicaid Other | Admitting: Physical Therapy

## 2020-01-02 ENCOUNTER — Other Ambulatory Visit: Payer: Self-pay | Admitting: Family Medicine

## 2020-01-02 DIAGNOSIS — Z0000 Encounter for general adult medical examination without abnormal findings: Secondary | ICD-10-CM

## 2020-02-16 ENCOUNTER — Other Ambulatory Visit: Payer: Self-pay

## 2020-02-16 ENCOUNTER — Ambulatory Visit
Admission: RE | Admit: 2020-02-16 | Discharge: 2020-02-16 | Disposition: A | Discharge status: Home / Self Care | Payer: Medicaid Other | Source: Ambulatory Visit | Attending: Family Medicine | Admitting: Family Medicine

## 2020-02-16 DIAGNOSIS — Z0000 Encounter for general adult medical examination without abnormal findings: Secondary | ICD-10-CM

## 2020-09-09 IMAGING — CR DG LUMBAR SPINE COMPLETE 4+V
5 series · 5 of 5 positions shown · non-contrast
Comparison: 12/13/2006

CLINICAL DATA: Low back pain with bilateral lower extremity
radicular symptoms.

EXAM:
LUMBAR SPINE - COMPLETE 4+ VIEW

[t l-spine a.p.]
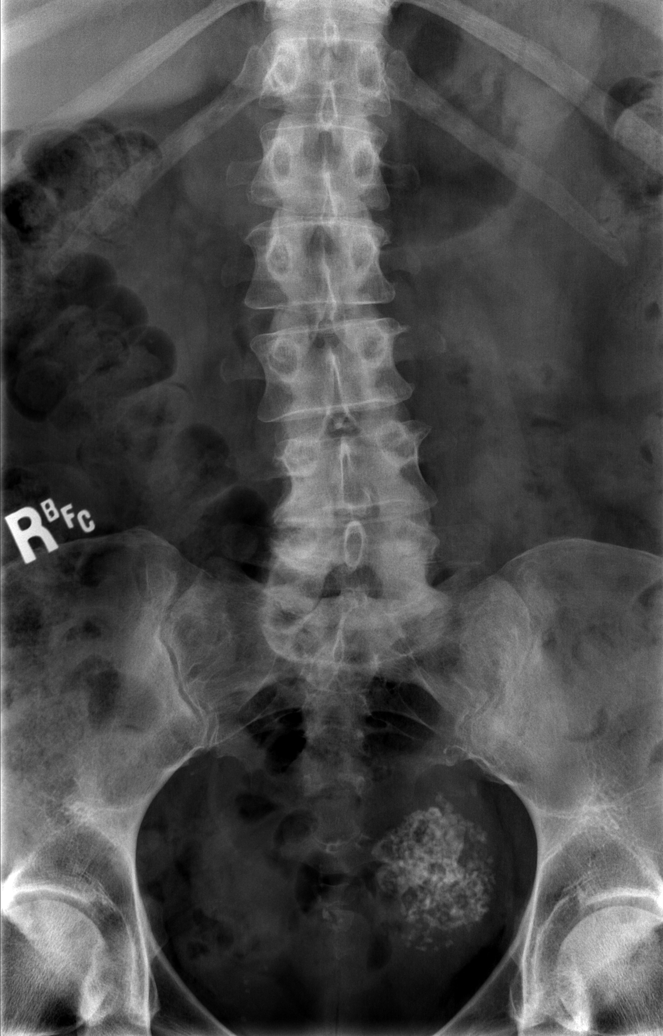

[t l-spine oblique exposure (1 of 2)]
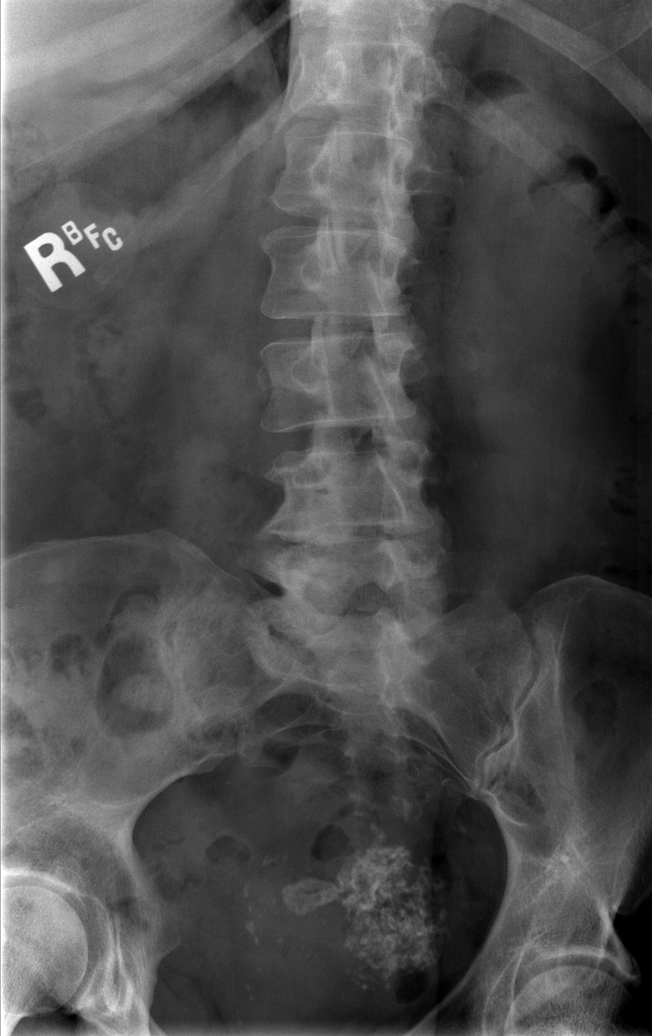

[t l-spine oblique exposure (2 of 2)]
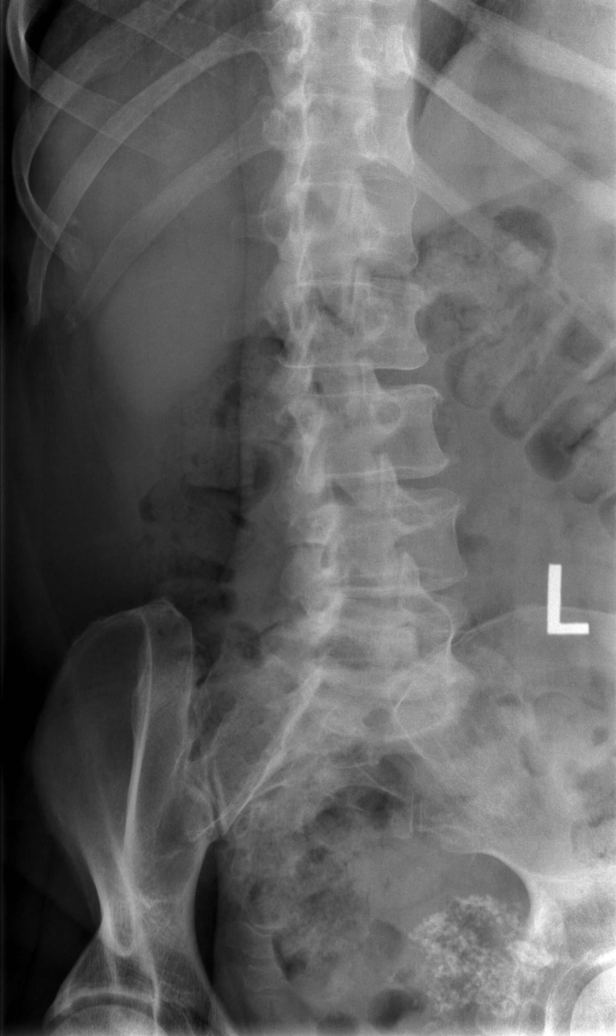

[t l-spine lat]
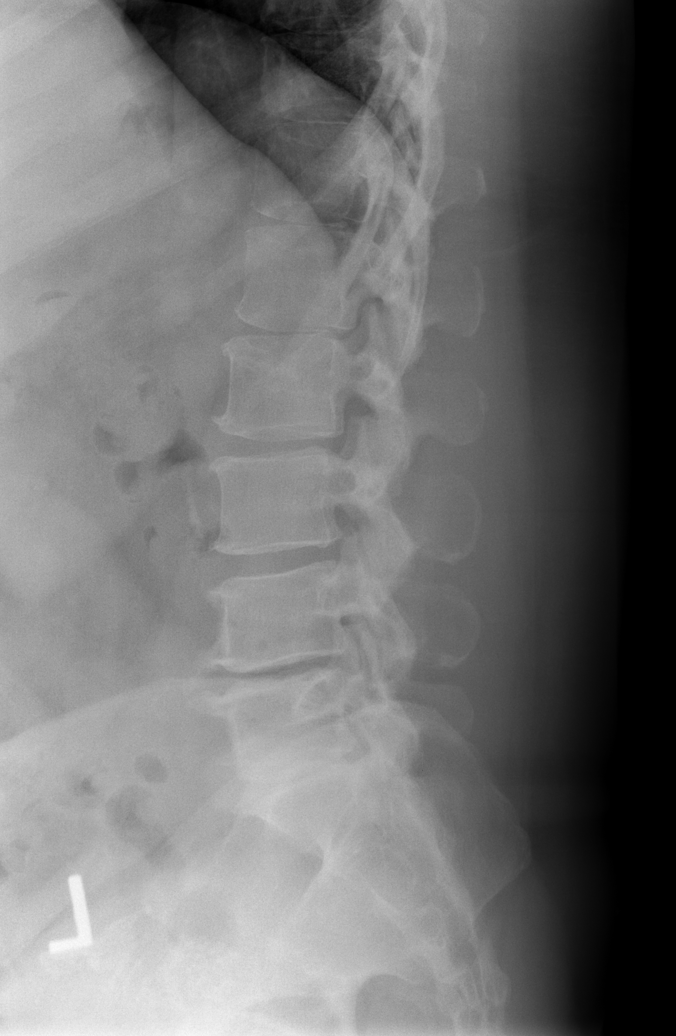

[t l-spine l5-s1 spot]
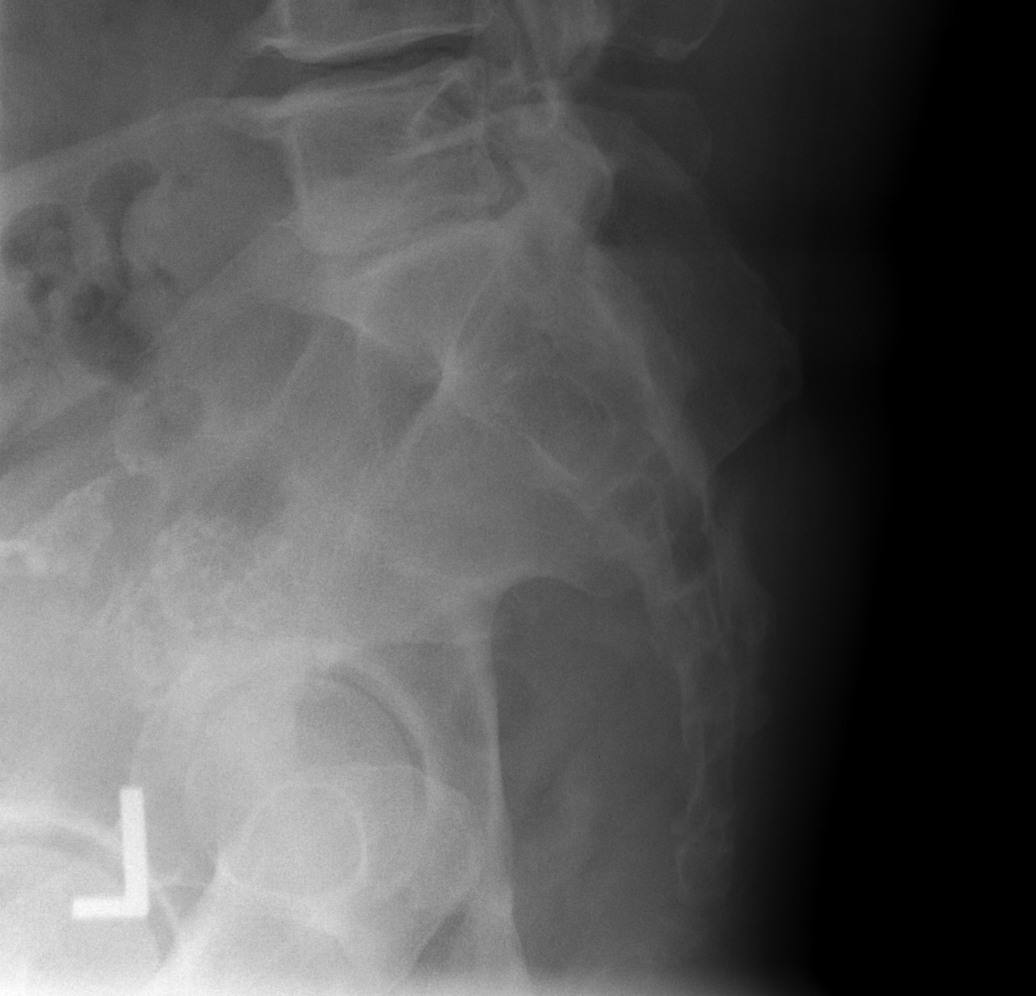

[5 of 5 positions shown; findings below may reference images not displayed]

FINDINGS: Frontal, bilateral oblique, lateral views of the lumbar spine are
obtained. Mild rotatory scoliosis is again seen. Five
non-rib-bearing lumbar type vertebral bodies are in otherwise
anatomic alignment. There is progressive spondylosis of the lower
lumbar spine, most pronounced at L4/L5 and to a greater extent
L5/S1. At these levels there is significant disc space narrowing and
circumferential osteophyte formation. Mild facet hypertrophy is seen
at these levels as well.

There are no acute fractures. Sacroiliac joints are normal.
Calcified uterine fibroid is noted.
IMPRESSION: 1. Progressive lower lumbar spondylosis at L4/L5 and L5/S1.
2. Mild rotatory scoliosis.

## 2020-11-18 ENCOUNTER — Other Ambulatory Visit: Payer: Self-pay | Admitting: Sports Medicine

## 2020-11-18 DIAGNOSIS — M549 Dorsalgia, unspecified: Secondary | ICD-10-CM

## 2020-12-01 ENCOUNTER — Other Ambulatory Visit: Payer: Medicaid Other

## 2020-12-25 ENCOUNTER — Other Ambulatory Visit: Payer: Medicaid Other

## 2021-01-06 ENCOUNTER — Other Ambulatory Visit: Payer: Self-pay | Admitting: Family Medicine

## 2021-01-06 DIAGNOSIS — Z1231 Encounter for screening mammogram for malignant neoplasm of breast: Secondary | ICD-10-CM

## 2021-01-07 ENCOUNTER — Other Ambulatory Visit: Payer: Self-pay

## 2021-01-07 ENCOUNTER — Ambulatory Visit
Admission: RE | Admit: 2021-01-07 | Discharge: 2021-01-07 | Disposition: A | Discharge status: Home / Self Care | Payer: Medicaid Other | Source: Ambulatory Visit | Attending: Sports Medicine | Admitting: Sports Medicine

## 2021-01-07 DIAGNOSIS — M549 Dorsalgia, unspecified: Secondary | ICD-10-CM

## 2021-02-16 ENCOUNTER — Ambulatory Visit
Admission: RE | Admit: 2021-02-16 | Discharge: 2021-02-16 | Disposition: A | Discharge status: Home / Self Care | Payer: Medicaid Other | Source: Ambulatory Visit | Attending: Family Medicine | Admitting: Family Medicine

## 2021-02-16 DIAGNOSIS — Z1231 Encounter for screening mammogram for malignant neoplasm of breast: Secondary | ICD-10-CM

## 2021-07-14 ENCOUNTER — Ambulatory Visit: Payer: Medicaid Other | Admitting: Physical Therapy

## 2021-07-19 ENCOUNTER — Other Ambulatory Visit: Payer: Self-pay

## 2021-07-19 ENCOUNTER — Ambulatory Visit: Payer: Medicaid Other | Attending: Nurse Practitioner | Admitting: Physical Therapy

## 2021-07-19 ENCOUNTER — Encounter: Payer: Self-pay | Admitting: Physical Therapy

## 2021-07-19 DIAGNOSIS — M5459 Other low back pain: Secondary | ICD-10-CM | POA: Insufficient documentation

## 2021-07-19 DIAGNOSIS — M6281 Muscle weakness (generalized): Secondary | ICD-10-CM | POA: Insufficient documentation
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
## 2021-08-01 ENCOUNTER — Other Ambulatory Visit: Payer: Self-pay

## 2021-08-01 ENCOUNTER — Encounter: Payer: Self-pay | Admitting: Physical Therapy

## 2021-08-01 ENCOUNTER — Ambulatory Visit: Payer: Medicaid Other | Attending: Nurse Practitioner | Admitting: Physical Therapy

## 2021-08-01 DIAGNOSIS — M6281 Muscle weakness (generalized): Secondary | ICD-10-CM | POA: Insufficient documentation

## 2021-08-01 DIAGNOSIS — M5459 Other low back pain: Secondary | ICD-10-CM | POA: Diagnosis not present

## 2021-08-01 NOTE — Patient Instructions (Signed)
Access Code: [email or web address] URL: [email or web address] Date: 08/01/2021 Prepared by: Rosana Hoes  Exercises - Supine Lower Trunk Rotation  - 2 x daily - 5 reps - 10 seconds hold - Supine Piriformis Stretch with Foot on Ground  - 2 x daily - 3 reps - 20 seconds hold - Active Straight Leg Raise with Quad Set  - 1 x daily - 2 sets - 10 reps - Bridge  - 1 x daily - 2 sets - 10 reps - 2 seconds hold - Supine 90/90 Abdominal Bracing  - 1 x daily - 2 sets - 10 reps - 10 seconds hold - Clam with Resistance  - 1 x daily - 2 sets - 10 reps

## 2021-08-01 NOTE — Therapy (Signed)
OUTPATIENT PHYSICAL THERAPY TREATMENT NOTE   Patient Name: Morgan Jacobson MRN: [id number] DOB:1965/09/29, 56 y.o., female Today's Date: 08/01/2021  PCP: Mirna Mires, MD   REFERRING PROVIDER: Merryl Hacker, NP   END OF SESSION:   PT End of Session - 08/01/21 1122     Visit Number 2    Number of Visits 8    Date for PT Re-Evaluation 09/13/21    Authorization Type U[employer] MCD    Authorization - Number of Visits 27    PT Start Time 1045    PT Stop Time 1130    PT Time Calculation (min) 45 min    Activity Tolerance Patient tolerated treatment well    Behavior During Therapy [facility] for tasks assessed/performed             History reviewed. No pertinent past medical history. History reviewed. No pertinent surgical history. There are no problems to display for this patient.   REFERRING DIAG: LUMBOSACRAL SPONDYLOSIS WITH RADUCULOPATHY  THERAPY DIAG:  Other low back pain  Muscle weakness (generalized)  Rationale for Evaluation and Treatment Rehabilitation  PERTINENT HISTORY: None  PRECAUTIONS: None  SUBJECTIVE: Patient reports she is doing well, she feels a little better than last visit. She has been working on her HEP.  PAIN:  Are you having pain? Yes:  NPRS scale: 6/10 (8-9/10 with walking) Pain location: Lower back and radiating right buttock and posterior thigh Pain description: Constant, sharp pains, pulling Aggravating factors: Worse in morning, walking, sitting long periods Relieving factors: Medication, heating pad  PATIENT GOALS: Pain relief   OBJECTIVE: (objective measures completed at initial evaluation unless otherwise dated) PATIENT SURVEYS:  Modified Oswestry 29/50     MUSCLE LENGTH: Hip flexor/quad slightly limited, piriformis slightly limited   POSTURE:  Rounded shoulder and forward head, decreased lumbar lordosis   PALPATION: Mildly tender to palpation at bilateral lumbar paraspinals and gluteal/piriformis region   LUMBAR ROM:     Active  A/PROM  eval  Flexion 75%  Extension 50%  Right lateral flexion 75%  Left lateral flexion 75%  Right rotation 75%  Left rotation 75%   LOWER EXTREMITY MMT:     MMT Right eval Left eval  Hip flexion 4- 4-  Hip extension 4- 4-  Hip abduction 4- 4-  Knee flexion 4 4  Knee extension 4 4    FUNCTIONAL TESTS:  5 times sit to stand: 11 seconds   GAIT: Assistive device utilized: None Level of assistance: Complete Independence Comments: Trendelenburg     TODAY'S TREATMENT  [facility] Adult PT Treatment:                                                DATE: 08/01/2021 Therapeutic Exercise: NuStep L6 x 6 min with UE/LE while taking subjective LTR 10 x 10 sec Piriformis stretch x 20 sec each SLR 2 x 20 each Bridge 2 x 20 90-90 table top hold 2 x 10 with 10 sec hold Sidelying hip abduction 2 x 15 each Seated hip hinge training Sit to stand with hip hinge and 15# lifting from floor 3 x 10 Pallof press with black band 2 x 10 each   [facility] Adult PT Treatment:  DATE: 07/19/2021 Therapeutic Exercise: LTR 5 x 10 sec Piriformis stretch 2 x 20 sec each SLR x 10 each Bridge 10 x 2 sec Side clam with red x 10   PATIENT EDUCATION:  Education details: HEP update Person educated: Patient Education method: Explanation, Demonstration, Tactile cues, Verbal cues, Handout Education comprehension: verbalized understanding, returned demonstration, verbal cues required, tactile cues required, and needs further education   HOME EXERCISE PROGRAM: Access Code: [email or web address]     ASSESSMENT: CLINICAL IMPRESSION: Patient tolerated therapy well with no adverse effects. Therapy focused on progressing core and hip strengthening this visit with good tolerance. Incorporated hip hinge training and lifting this visit, she does require consistent cueing for proper technique to avoid excessive spinal flexion. Updated HEP to progress core stabilization at  home. Patient would benefit from continued skilled PT to progress her mobility and strength in order to reduce pain and maximize functional ability.     OBJECTIVE IMPAIRMENTS decreased activity tolerance, decreased ROM, decreased strength, impaired flexibility, improper body mechanics, postural dysfunction, and pain.    ACTIVITY LIMITATIONS carrying, lifting, bending, sitting, standing, squatting, and locomotion level   PARTICIPATION LIMITATIONS: meal prep, cleaning, laundry, driving, community activity, occupation, and yard work   PERSONAL FACTORS Fitness, Past/current experiences, and Time since onset of injury/illness/exacerbation are also affecting patient's functional outcome.      GOALS: Goals reviewed with patient? Yes   SHORT TERM GOALS: Target date: 08/16/2021   Patient will be I with initial HEP in order to progress with therapy. Baseline: HEP provided at eval Goal status: INITIAL   2.  Patient will report </= 5/10 pain level with walking and activity to reduce functional limitations Baseline: 8-9/10 Goal status: INITIAL   3.  Patient will be able to walk >/= 15 minutes without rest break to improve ability to grocery shop and access community  Baseline: 5-10 minutes Goal status: INITIAL   LONG TERM GOALS: Target date: 09/13/2021   Patient will be I with final HEP to maintain progress from PT. Baseline: HEP provided at eval Goal status: INITIAL   2.  Patient will report modified ODI </= 20/50 in order to indicate improved functional ability. Baseline: 29/50 Goal status: INITIAL   3.  Patient will exhibit gross hip strength of >/= 4/5 MMT bilaterally and knee strength of >/= 5/5 MMT bilaterally to improve walking and stair negotiation Baseline: hip strength grossly 4-/5 MMT and knee strength grossly 4/5 MMT Goal status: INITIAL   4.  Patient will report </= 3/10 pain level with walking community level distances in order to improve community access Baseline:  8-9/10 Goal status: INITIAL     PLAN: PT FREQUENCY: 1-2x/week   PT DURATION: 8 weeks   PLANNED INTERVENTIONS: Therapeutic exercises, Therapeutic activity, Neuromuscular re-education, Balance training, Gait training, Patient/Family education, Joint manipulation, Joint mobilization, Aquatic Therapy, Dry Needling, Electrical stimulation, Spinal manipulation, Spinal mobilization, Cryotherapy, Moist heat, Manual therapy, and Re-evaluation.   PLAN FOR NEXT SESSION: Review HEP and progress PRN, continue lumbar and hip flexibility, progress core and hip strengthening as tolerated   Rosana Hoes, PT, DPT, LAT, ATC 08/01/21  11:35 AM Phone: [phone] Fax: [phone]

## 2021-08-08 ENCOUNTER — Other Ambulatory Visit: Payer: Self-pay

## 2021-08-08 ENCOUNTER — Ambulatory Visit: Payer: Medicaid Other | Admitting: Physical Therapy

## 2021-08-08 ENCOUNTER — Encounter: Payer: Self-pay | Admitting: Physical Therapy

## 2021-08-08 DIAGNOSIS — M5459 Other low back pain: Secondary | ICD-10-CM | POA: Diagnosis not present

## 2021-08-08 DIAGNOSIS — M6281 Muscle weakness (generalized): Secondary | ICD-10-CM

## 2021-08-08 NOTE — Therapy (Addendum)
OUTPATIENT PHYSICAL THERAPY TREATMENT NOTE  DISCHARGE   Patient Name: Morgan Jacobson MRN: [id number] DOB:Aug 26, 1965, 56 y.o., female Today's Date: 08/08/2021  PCP: Iona Beard, MD   REFERRING PROVIDER: Jeanella Anton, NP   END OF SESSION:   PT End of Session - 08/08/21 1035     Visit Number 3    Number of Visits 8    Date for PT Re-Evaluation 09/13/21    Authorization Type [employer] MCD    Authorization - Number of Visits 27    PT Start Time 1045    PT Stop Time 1125    PT Time Calculation (min) 40 min    Activity Tolerance Patient tolerated treatment well    Behavior During Therapy [facility] for tasks assessed/performed              History reviewed. No pertinent past medical history. History reviewed. No pertinent surgical history. There are no problems to display for this patient.   REFERRING DIAG: LUMBOSACRAL SPONDYLOSIS WITH RADUCULOPATHY  THERAPY DIAG:  Other low back pain  Muscle weakness (generalized)  Rationale for Evaluation and Treatment Rehabilitation  PERTINENT HISTORY: None  PRECAUTIONS: None   SUBJECTIVE: Patient reports she is doing well, she feels good during and right after therapy but she does have increased soreness the day or two after therapy. She has been a little inconsistent with her exercises.  PAIN:  Are you having pain? Yes:  NPRS scale: 5/10 (8-9/10 with walking) Pain location: Lower back and radiating right buttock and posterior thigh Pain description: Constant, sharp pains, pulling Aggravating factors: Worse in morning, walking, sitting long periods Relieving factors: Medication, heating pad  PATIENT GOALS: Pain relief   OBJECTIVE: (objective measures completed at initial evaluation unless otherwise dated) PATIENT SURVEYS:  Modified Oswestry 29/50     MUSCLE LENGTH: Hip flexor/quad slightly limited, piriformis slightly limited   POSTURE:  Rounded shoulder and forward head, decreased lumbar lordosis    PALPATION: Mildly tender to palpation at bilateral lumbar paraspinals and gluteal/piriformis region   LUMBAR ROM:    Active  A/PROM  eval  Flexion 75%  Extension 50%  Right lateral flexion 75%  Left lateral flexion 75%  Right rotation 75%  Left rotation 75%   LOWER EXTREMITY MMT:     MMT Right eval Left eval Rt / Lt 08/08/2021  Hip flexion 4- 4-   Hip extension 4- 4- 4- / 4-  Hip abduction 4- 4- 4- / 4-  Knee flexion 4 4   Knee extension 4 4     FUNCTIONAL TESTS:  5 times sit to stand: 11 seconds   GAIT: Assistive device utilized: None Level of assistance: Complete Independence Comments: Trendelenburg     TODAY'S TREATMENT  [facility] Adult PT Treatment:                                                DATE: 08/08/2021 Therapeutic Exercise: NuStep L6 x 5 min with UE/LE while taking subjective LTR 10 x 10 sec Piriformis stretch x 30 sec each SLR 2 x 20 each Bridge 2 x 10 with 10 sec hold 90-90 table top hold 2 x 10 with 10 sec hold Sidelying hip abduction 2 x 15 each Sit to stand with hip hinge and 15# lifting from floor 2 x 10 Deadlift with 25# from floor x 10 Pallof press with FM 7#  2 x 10 each Seated lumbar flexion stretch with physioball 5 x 10 sec   [facility] Adult PT Treatment:                                                DATE: 08/01/2021 Therapeutic Exercise: NuStep L6 x 6 min with UE/LE while taking subjective LTR 10 x 10 sec Piriformis stretch x 20 sec each SLR 2 x 20 each Bridge 2 x 20 90-90 table top hold 2 x 10 with 10 sec hold Sidelying hip abduction 2 x 15 each Seated hip hinge training Sit to stand with hip hinge and 15# lifting from floor 3 x 10 Pallof press with black band 2 x 10 each  [facility] Adult PT Treatment:                                                DATE: 07/19/2021 Therapeutic Exercise: LTR 5 x 10 sec Piriformis stretch 2 x 20 sec each SLR x 10 each Bridge 10 x 2 sec Side clam with red x 10   PATIENT EDUCATION:  Education details:  HEP update Person educated: Patient Education method: Explanation, Demonstration, Tactile cues, Verbal cues, Handout Education comprehension: verbalized understanding, returned demonstration, verbal cues required, tactile cues required, and needs further education   HOME EXERCISE PROGRAM: Access Code: [email or web address]     ASSESSMENT: CLINICAL IMPRESSION: Patient tolerated therapy well with no adverse effects. Therapy continues to focus primarily on progression of core and hip strengthening wit good tolerance. She does continue to exhibit gross strength deficits. She was able to progress with her lifting this visit, but does require consistent cueing for proper hip hinge technique to avoid excessive lumbar flexion with lifting. Updated HEP to progress strengthening at home. Patient would benefit from continued skilled PT to progress her mobility and strength in order to reduce pain and maximize functional ability.     OBJECTIVE IMPAIRMENTS decreased activity tolerance, decreased ROM, decreased strength, impaired flexibility, improper body mechanics, postural dysfunction, and pain.    ACTIVITY LIMITATIONS carrying, lifting, bending, sitting, standing, squatting, and locomotion level   PARTICIPATION LIMITATIONS: meal prep, cleaning, laundry, driving, community activity, occupation, and yard work   PERSONAL FACTORS Fitness, Past/current experiences, and Time since onset of injury/illness/exacerbation are also affecting patient's functional outcome.      GOALS: Goals reviewed with patient? Yes   SHORT TERM GOALS: Target date: 08/16/2021   Patient will be I with initial HEP in order to progress with therapy. Baseline: HEP provided at eval Goal status: INITIAL   2.  Patient will report </= 5/10 pain level with walking and activity to reduce functional limitations Baseline: 8-9/10 Goal status: INITIAL   3.  Patient will be able to walk >/= 15 minutes without rest break to improve ability to  grocery shop and access community  Baseline: 5-10 minutes Goal status: INITIAL   LONG TERM GOALS: Target date: 09/13/2021   Patient will be I with final HEP to maintain progress from PT. Baseline: HEP provided at eval Goal status: INITIAL   2.  Patient will report modified ODI </= 20/50 in order to indicate improved functional ability. Baseline: 29/50 Goal status: INITIAL   3.  Patient will exhibit gross hip  strength of >/= 4/5 MMT bilaterally and knee strength of >/= 5/5 MMT bilaterally to improve walking and stair negotiation Baseline: hip strength grossly 4-/5 MMT and knee strength grossly 4/5 MMT Goal status: INITIAL   4.  Patient will report </= 3/10 pain level with walking community level distances in order to improve community access Baseline: 8-9/10 Goal status: INITIAL     PLAN: PT FREQUENCY: 1-2x/week   PT DURATION: 8 weeks   PLANNED INTERVENTIONS: Therapeutic exercises, Therapeutic activity, Neuromuscular re-education, Balance training, Gait training, Patient/Family education, Joint manipulation, Joint mobilization, Aquatic Therapy, Dry Needling, Electrical stimulation, Spinal manipulation, Spinal mobilization, Cryotherapy, Moist heat, Manual therapy, and Re-evaluation.   PLAN FOR NEXT SESSION: Review HEP and progress PRN, continue lumbar and hip flexibility, progress core and hip strengthening as tolerated   Hilda Blades, PT, DPT, LAT, ATC 08/08/21  11:28 AM Phone: [phone] Fax: [phone]    PHYSICAL THERAPY DISCHARGE SUMMARY  Visits from Start of Care: 3  Current functional level related to goals / functional outcomes: See above   Remaining deficits: See above   Education / Equipment: HEP   Patient agrees to discharge. Patient goals were not met. Patient is being discharged due to not returning since the last visit.  Hilda Blades, PT, DPT, LAT, ATC 10/11/21  4:39 PM Phone: [phone] Fax: [phone]

## 2021-08-08 NOTE — Patient Instructions (Signed)
Access Code: [email or web address] URL: [email or web address] Date: 08/08/2021 Prepared by: Rosana Hoes  Exercises - Supine Lower Trunk Rotation  - 2 x daily - 5 reps - 10 seconds hold - Supine Piriformis Stretch with Foot on Ground  - 2 x daily - 3 reps - 20 seconds hold - Active Straight Leg Raise with Quad Set  - 1 x daily - 2 sets - 10 reps - Bridge  - 1 x daily - 2 sets - 10 reps - 5 seconds hold - Supine 90/90 Abdominal Bracing  - 1 x daily - 2 sets - 10 reps - 10 seconds hold - Clam with Resistance  - 1 x daily - 2 sets - 10 reps - Sidelying Hip Abduction  - 1 x daily - 2 sets - 15 reps - Sit to Stand Without Arm Support  - 1 x daily - 3 sets - 10 reps

## 2021-08-15 ENCOUNTER — Ambulatory Visit: Payer: Medicaid Other | Admitting: Physical Therapy

## 2021-08-22 ENCOUNTER — Ambulatory Visit: Payer: Medicaid Other | Admitting: Physical Therapy

## 2021-11-01 ENCOUNTER — Ambulatory Visit: Payer: Medicaid Other | Admitting: Physical Therapy

## 2021-11-08 ENCOUNTER — Ambulatory Visit: Payer: Medicaid Other
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
## 2021-11-15 ENCOUNTER — Encounter: Payer: Self-pay | Admitting: Physical Therapy

## 2021-11-15 ENCOUNTER — Other Ambulatory Visit: Payer: Self-pay

## 2021-11-15 ENCOUNTER — Ambulatory Visit: Payer: Medicaid Other | Attending: Nurse Practitioner | Admitting: Physical Therapy

## 2021-11-15 DIAGNOSIS — R2689 Other abnormalities of gait and mobility: Secondary | ICD-10-CM | POA: Insufficient documentation

## 2021-11-15 DIAGNOSIS — M6281 Muscle weakness (generalized): Secondary | ICD-10-CM | POA: Insufficient documentation

## 2021-11-15 DIAGNOSIS — M5459 Other low back pain: Secondary | ICD-10-CM | POA: Insufficient documentation

## 2021-11-15 NOTE — Patient Instructions (Signed)
Access Code: [email or web address] URL: [email or web address] Date: 11/15/2021 Prepared by: Hilda Blades  Exercises - Supine Lower Trunk Rotation  - 2 x daily - 10 reps - 5 seconds hold - Supine Piriformis Stretch with Foot on Ground  - 2 x daily - 3 reps - 20 seconds hold - Hooklying Clamshell with Resistance  - 1 x daily - 2 sets - 15 reps - Bridge with Resistance  - 1 x daily - 2 sets - 10 reps - Active Straight Leg Raise with Quad Set  - 1 x daily - 2 sets - 15 reps - Sidelying Hip Abduction  - 1 x daily - 2 sets - 15 reps - Sit to Stand Without Arm Support  - 1 x daily - 3 sets - 10 reps

## 2021-11-23 ENCOUNTER — Ambulatory Visit: Payer: Medicaid Other | Admitting: Physical Therapy

## 2021-11-30 ENCOUNTER — Ambulatory Visit: Payer: Medicaid Other | Admitting: Physical Therapy

## 2021-12-07 ENCOUNTER — Encounter: Payer: Medicaid Other | Admitting: Physical Therapy

## 2021-12-14 ENCOUNTER — Encounter: Payer: Medicaid Other | Admitting: Physical Therapy

## 2022-01-10 ENCOUNTER — Other Ambulatory Visit (HOSPITAL_COMMUNITY)
Admission: RE | Admit: 2022-01-10 | Discharge: 2022-01-10 | Disposition: A | Discharge status: Home / Self Care | Payer: Medicaid Other | Source: Ambulatory Visit | Attending: Family Medicine | Admitting: Family Medicine

## 2022-01-10 ENCOUNTER — Other Ambulatory Visit: Payer: Self-pay | Admitting: Family Medicine

## 2022-01-10 DIAGNOSIS — Z124 Encounter for screening for malignant neoplasm of cervix: Secondary | ICD-10-CM | POA: Diagnosis not present

## 2022-01-10 DIAGNOSIS — Z1231 Encounter for screening mammogram for malignant neoplasm of breast: Secondary | ICD-10-CM

## 2022-01-17 LAB — CYTOLOGY - PAP
Chlamydia: NEGATIVE
Comment: NEGATIVE
Comment: NEGATIVE
Comment: NORMAL
Diagnosis: NEGATIVE
Neisseria Gonorrhea: NEGATIVE
Trichomonas: NEGATIVE

## 2022-03-07 ENCOUNTER — Ambulatory Visit
Admission: RE | Admit: 2022-03-07 | Discharge: 2022-03-07 | Disposition: A | Discharge status: Home / Self Care | Payer: Medicaid Other | Source: Ambulatory Visit | Attending: Family Medicine | Admitting: Family Medicine

## 2022-03-07 DIAGNOSIS — Z1231 Encounter for screening mammogram for malignant neoplasm of breast: Secondary | ICD-10-CM
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
## 2022-06-05 ENCOUNTER — Ambulatory Visit: Payer: Medicaid Other | Attending: Family Medicine | Admitting: Physical Therapy

## 2022-06-05 ENCOUNTER — Encounter: Payer: Self-pay | Admitting: Physical Therapy

## 2022-06-05 ENCOUNTER — Other Ambulatory Visit: Payer: Self-pay

## 2022-06-05 DIAGNOSIS — M6281 Muscle weakness (generalized): Secondary | ICD-10-CM | POA: Diagnosis present

## 2022-06-05 DIAGNOSIS — M5459 Other low back pain: Secondary | ICD-10-CM | POA: Insufficient documentation

## 2022-06-05 DIAGNOSIS — R2689 Other abnormalities of gait and mobility: Secondary | ICD-10-CM | POA: Insufficient documentation

## 2022-06-07 ENCOUNTER — Ambulatory Visit: Payer: Medicaid Other | Admitting: Physical Therapy
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
## 2022-06-13 ENCOUNTER — Ambulatory Visit: Payer: Medicaid Other | Admitting: Physical Therapy

## 2022-06-13 DIAGNOSIS — R2689 Other abnormalities of gait and mobility: Secondary | ICD-10-CM

## 2022-06-13 DIAGNOSIS — M5459 Other low back pain: Secondary | ICD-10-CM | POA: Diagnosis not present

## 2022-06-13 DIAGNOSIS — M6281 Muscle weakness (generalized): Secondary | ICD-10-CM

## 2022-06-20 ENCOUNTER — Encounter: Payer: Self-pay | Admitting: Physical Therapy

## 2022-06-20 ENCOUNTER — Ambulatory Visit: Payer: Medicaid Other | Admitting: Physical Therapy

## 2022-06-20 DIAGNOSIS — M5459 Other low back pain: Secondary | ICD-10-CM

## 2022-06-20 DIAGNOSIS — M6281 Muscle weakness (generalized): Secondary | ICD-10-CM

## 2022-06-20 DIAGNOSIS — R2689 Other abnormalities of gait and mobility: Secondary | ICD-10-CM
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
## 2022-06-20 NOTE — Patient Instructions (Signed)

## 2022-06-28 ENCOUNTER — Encounter: Payer: Self-pay | Admitting: Physical Therapy

## 2022-06-28 ENCOUNTER — Ambulatory Visit: Payer: Medicaid Other | Attending: Family Medicine | Admitting: Physical Therapy

## 2022-06-28 DIAGNOSIS — M6281 Muscle weakness (generalized): Secondary | ICD-10-CM | POA: Diagnosis present

## 2022-06-28 DIAGNOSIS — R2689 Other abnormalities of gait and mobility: Secondary | ICD-10-CM

## 2022-06-28 DIAGNOSIS — M5459 Other low back pain: Secondary | ICD-10-CM
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
## 2022-07-03 ENCOUNTER — Encounter: Payer: Self-pay | Admitting: Physical Therapy

## 2022-07-03 ENCOUNTER — Ambulatory Visit: Payer: Medicaid Other | Admitting: Physical Therapy

## 2022-07-03 DIAGNOSIS — M5459 Other low back pain: Secondary | ICD-10-CM

## 2022-07-03 DIAGNOSIS — M6281 Muscle weakness (generalized): Secondary | ICD-10-CM

## 2022-07-03 DIAGNOSIS — R2689 Other abnormalities of gait and mobility: Secondary | ICD-10-CM
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
## 2022-07-19 ENCOUNTER — Ambulatory Visit: Payer: Medicaid Other | Admitting: Physical Therapy

## 2022-07-19 DIAGNOSIS — M6281 Muscle weakness (generalized): Secondary | ICD-10-CM

## 2022-07-19 DIAGNOSIS — R2689 Other abnormalities of gait and mobility: Secondary | ICD-10-CM

## 2022-07-19 DIAGNOSIS — M5459 Other low back pain: Secondary | ICD-10-CM

## 2022-07-26 ENCOUNTER — Ambulatory Visit: Payer: Medicaid Other | Admitting: Physical Therapy

## 2022-10-05 NOTE — Progress Notes (Signed)
Cardiology Office Note:  .   Date:  10/05/2022  ID:  [name], DOB 11-08-1965, MRN [id number] PCP: Mirna Mires, MD  [facility] Providers Cardiologist:  None    History of Present Illness: Marland Kitchen   Morgan Jacobson is a 57 y.o. female referral for EKG with occasional PVCs criteria met for LVH. Chronic lower back pain. HTN. Today her EKG is normal   History of Present Illness   Morgan Jacobson, with a history of back pain, presents for evaluation of an irregular heartbeat noted during a recent visit to a medical center. The irregularity was described as a single skipped beat from the ventricles. She denies experiencing any symptoms such as palpitations, tachycardia, or dyspnea. She reports feeling well overall.  She also mentions the recent loss of her sister in 2022, which was a stressful event. Her sister had multiple health issues including respiratory and renal failure. Morgan Jacobson is currently asymptomatic and continues to monitor her health regularly.      ROS:  per HPI otherwise negative   Studies Reviewed: Marland Kitchen        EKG Interpretation Date/Time:  Friday October 06 2022 09:24:12 EDT Ventricular Rate:  89 PR Interval:  168 QRS Duration:  90 QT Interval:  348 QTC Calculation: 423 R Axis:   63  Text Interpretation: Normal sinus rhythm Normal ECG No previous ECGs available Confirmed by Carolan Clines ([id number]) on 10/06/2022 9:34:51 AM  Risk Assessment/Calculations:    Physical Exam:   VS:  Vitals:   10/06/22 0924  BP: 124/80  Pulse: 89  SpO2: 92%     LMP 03/19/2012    Wt Readings from Last 3 Encounters:  03/18/19 252 lb (114.3 kg)    GEN: Well nourished, well developed in no acute distress NECK: No JVD; No carotid bruits CARDIAC: RRR, SEM at the RUSB, rubs, gallops RESPIRATORY:  Clear to auscultation without rales, wheezing or rhonchi  ABDOMEN: Soft, non-tender, non-distended EXTREMITIES:  No edema; No deformity   ASSESSMENT AND PLAN: .    Assessment and Plan  PVC - resolved  SEM - benign, likely mild AS. She is asymptomatic. Discussed if having symptoms to let us know  HTN - well controlled - continue current medication     Dispo:  FU PRN  Signed, Maisie Fus, MD

## 2022-10-06 ENCOUNTER — Encounter: Payer: Self-pay | Admitting: Internal Medicine

## 2022-10-06 ENCOUNTER — Ambulatory Visit: Payer: Medicaid Other | Attending: Internal Medicine | Admitting: Internal Medicine

## 2022-10-06 VITALS — BP 124/80 | HR 89 | Ht 69.0 in | Wt 235.2 lb

## 2022-10-06 DIAGNOSIS — Z136 Encounter for screening for cardiovascular disorders: Secondary | ICD-10-CM | POA: Diagnosis not present

## 2022-10-06 NOTE — Patient Instructions (Signed)
Medication Instructions:  Your physician recommends that you continue on your current medications as directed. Please refer to the Current Medication list given to you today.  *If you need a refill on your cardiac medications before your next appointment, please call your pharmacy*   Lab Work: NONE If you have labs (blood work) drawn today and your tests are completely normal, you will receive your results only by: MyChart Message (if you have MyChart) OR A paper copy in the mail If you have any lab test that is abnormal or we need to change your treatment, we will call you to review the results.   Testing/Procedures: NONE   Follow-Up: At [facility], you and your health needs are our priority.  As part of our continuing mission to provide you with exceptional heart care, we have created designated Provider Care Teams.  These Care Teams include your primary Cardiologist (physician) and Advanced Practice Providers (APPs -  Physician Assistants and Nurse Practitioners) who all work together to provide you with the care you need, when you need it.  We recommend signing up for the patient portal called "MyChart".  Sign up information is provided on this After Visit Summary.  MyChart is used to connect with patients for Virtual Visits (Telemedicine).  Patients are able to view lab/test results, encounter notes, upcoming appointments, etc.  Non-urgent messages can be sent to your provider as well.   To learn more about what you can do with MyChart, go to [email or web address].    Your next appointment:   AS NEEDED

## 2022-10-08 ENCOUNTER — Encounter (HOSPITAL_COMMUNITY): Payer: Self-pay

## 2022-10-08 ENCOUNTER — Ambulatory Visit (HOSPITAL_COMMUNITY)
Admission: EM | Admit: 2022-10-08 | Discharge: 2022-10-08 | Disposition: A | Discharge status: Home / Self Care | Payer: Medicaid Other

## 2022-10-08 DIAGNOSIS — J029 Acute pharyngitis, unspecified: Secondary | ICD-10-CM

## 2022-10-08 DIAGNOSIS — J36 Peritonsillar abscess: Secondary | ICD-10-CM | POA: Diagnosis not present

## 2022-10-08 HISTORY — DX: Hyperlipidemia, unspecified: E78.5

## 2022-10-08 HISTORY — DX: Essential (primary) hypertension: I10

## 2022-10-08 HISTORY — DX: Low back pain, unspecified: M54.50

## 2022-10-08 LAB — POCT RAPID STREP A (OFFICE): Rapid Strep A Screen: NEGATIVE

## 2022-10-08 MED ORDER — DEXAMETHASONE 10 MG/ML FOR PEDIATRIC ORAL USE
INTRAMUSCULAR | Status: AC
  Filled 2022-10-08: qty 1

## 2022-10-08 MED ORDER — IBUPROFEN 800 MG PO TABS
800.0000 mg | ORAL_TABLET | Freq: Once | ORAL | Status: AC
  Administered 2022-10-08: 800 mg via ORAL

## 2022-10-08 MED ORDER — DEXAMETHASONE SODIUM PHOSPHATE 10 MG/ML IJ SOLN
10.0000 mg | Freq: Once | INTRAMUSCULAR | Status: AC
  Administered 2022-10-08: 10 mg via INTRAMUSCULAR

## 2022-10-08 MED ORDER — IBUPROFEN 800 MG PO TABS
ORAL_TABLET | ORAL | Status: AC
  Filled 2022-10-08: qty 1

## 2022-10-08 MED ORDER — IBUPROFEN 800 MG PO TABS: 800.0000 mg | ORAL_TABLET | Freq: Three times a day (TID) | ORAL | 0 refills | Status: AC | PRN

## 2022-10-08 MED ORDER — LIDOCAINE VISCOUS HCL 2 % MT SOLN: 15.0000 mL | OROMUCOSAL | 0 refills | Status: AC | PRN

## 2022-10-08 MED ORDER — AMOXICILLIN-POT CLAVULANATE 875-125 MG PO TABS: 1.0000 | ORAL_TABLET | Freq: Two times a day (BID) | ORAL | 0 refills | Status: AC

## 2022-10-08 MED ORDER — ACETAMINOPHEN 500 MG PO TABS: 1000.0000 mg | ORAL_TABLET | Freq: Four times a day (QID) | ORAL | 0 refills | Status: AC | PRN

## 2022-10-08 NOTE — Discharge Instructions (Addendum)
Please start taking antibiotic as prescribed  until finished. You can use lidocaine as needed for sore throat. You can also alternate between Tylenol and Ibuprofen as needed for pain and fever. Follow-up with ENT or return here if symptoms persist.

## 2022-10-08 NOTE — ED Triage Notes (Signed)
Patient here today with c/o ST, fever, body aches, chills, sweats, headache, and belly ache since last week. She has tried taking Theraflu, Mucinex, and goody powder with no relief. No sick contacts.Last taken Tylenol #2 at 12 pm today.

## 2022-10-08 NOTE — ED Provider Notes (Signed)
[facility]    CSN: [id number] Arrival date & time: 10/08/22  1623      History   Chief Complaint Chief Complaint  Patient presents with   Sore Throat    HPI Morgan Jacobson is a 57 y.o. female.   Patient presents with sore throat, fever, body aches, chills, sweats, headache, and slight bellyache since last week.  Patient states she has taken TheraFlu, Mucinex, and Goody powder with no relief.  Denies abdominal pain, nausea, vomiting, diarrhea, cough, shortness of breath.   Sore Throat Associated symptoms include headaches. Pertinent negatives include no chest pain, no abdominal pain and no shortness of breath.    Past Medical History:  Diagnosis Date   Hyperlipidemia    Hypertension    Lumbago     There are no problems to display for this patient.   Past Surgical History:  Procedure Laterality Date   LEG SURGERY Left     OB History   No obstetric history on file.      Home Medications    Prior to Admission medications   Medication Sig Start Date End Date Taking? Authorizing Provider  acetaminophen (TYLENOL) 500 MG tablet Take 2 tablets (1,000 mg total) by mouth every 6 (six) hours as needed for mild pain or moderate pain. 10/08/22  Yes Susann Givens, Jamahl Lemmons A, NP  acetaminophen-codeine (TYLENOL #2) 300-15 MG tablet Take 1 tablet by mouth 3 (three) times daily. 10/05/22  Yes [provider]  amLODipine (NORVASC) 10 MG tablet Take 10 mg by mouth daily. 01/05/19  Yes [provider]  amoxicillin-clavulanate (AUGMENTIN) 875-125 MG tablet Take 1 tablet by mouth every 12 (twelve) hours for 7 days. 10/08/22 10/15/22 Yes Heitor Steinhoff A, NP  hydrochlorothiazide (HYDRODIURIL) 25 MG tablet Take 25 mg by mouth daily. 01/06/19  Yes [provider]  ibuprofen (ADVIL) 800 MG tablet Take 1 tablet (800 mg total) by mouth 3 (three) times daily as needed for fever, mild pain or moderate pain. 10/08/22  Yes Susann Givens, Xiamara Hulet A, NP   lidocaine (XYLOCAINE) 2 % solution Use as directed 15 mLs in the mouth or throat as needed for mouth pain. 10/08/22  Yes Susann Givens, Adeeb Konecny A, NP  losartan (COZAAR) 100 MG tablet Take 100 mg by mouth daily. 01/13/19  Yes [provider]  pravastatin (PRAVACHOL) 20 MG tablet Take 20 mg by mouth at bedtime. 01/21/19  Yes [provider]  diclofenac Sodium (VOLTAREN) 1 % GEL Apply 2 g topically 4 (four) times daily. 03/18/19   Darr, Gerilyn Pilgrim, PA-C  famotidine (PEPCID) 20 MG tablet Take 20 mg by mouth daily. 02/21/19   [provider]    Family History Family History  Problem Relation Age of Onset   Stroke Mother    Heart Problems Mother    Stomach cancer Father     Social History Social History   Tobacco Use   Smoking status: Never   Smokeless tobacco: Never  Vaping Use   Vaping status: Never Used  Substance Use Topics   Alcohol use: Not Currently   Drug use: Never     Allergies   Tomato   Review of Systems Review of Systems  Constitutional:  Positive for appetite change, chills, fatigue and fever.  HENT:  Positive for sore throat and trouble swallowing. Negative for congestion and rhinorrhea.   Respiratory:  Negative for cough and shortness of breath.   Cardiovascular:  Negative for chest pain.  Gastrointestinal:  Negative for abdominal pain, diarrhea, nausea and vomiting.  Neurological:  Positive for headaches. Negative for dizziness, weakness and light-headedness.     Physical Exam Triage Vital Signs ED Triage Vitals  Encounter Vitals Group     BP 10/08/22 1651 (!) 152/91     Systolic BP Percentile --      Diastolic BP Percentile --      Pulse Rate 10/08/22 1651 97     Resp 10/08/22 1651 16     Temp 10/08/22 1651 (!) 100.4 F (38 C)     Temp Source 10/08/22 1651 Oral     SpO2 10/08/22 1651 94 %     Weight 10/08/22 1651 243 lb (110.2 kg)     Height 10/08/22 1651 5\' 9"  (1.753 m)     Head Circumference --      Peak Flow --      Pain Score  10/08/22 1650 10     Pain Loc --      Pain Education --      Exclude from Growth Chart --    No data found.  Updated Vital Signs BP (!) 152/91 (BP Location: Right Arm)   Pulse 97   Temp (!) 100.4 F (38 C) (Oral)   Resp 16   Ht 5\' 9"  (1.753 m)   Wt 243 lb (110.2 kg)   LMP 03/19/2012   SpO2 94%   BMI 35.88 kg/m   Visual Acuity Right Eye Distance:   Left Eye Distance:   Bilateral Distance:    Right Eye Near:   Left Eye Near:    Bilateral Near:     Physical Exam Vitals and nursing note reviewed.  Constitutional:      General: She is awake. She is not in acute distress.    Appearance: Normal appearance. She is well-developed and well-groomed. She is not ill-appearing, toxic-appearing or diaphoretic.  HENT:     Right Ear: Tympanic membrane and ear canal normal.     Left Ear: Tympanic membrane and ear canal normal.     Nose: No congestion or rhinorrhea.     Mouth/Throat:     Mouth: Mucous membranes are moist.     Pharynx: Pharyngeal swelling present. No oropharyngeal exudate, posterior oropharyngeal erythema, uvula swelling or postnasal drip.     Tonsils: Tonsillar abscess present. No tonsillar exudate.     Comments: Significant R tonsillar edema and erythema. Cardiovascular:     Rate and Rhythm: Normal rate.     Heart sounds: Normal heart sounds.  Pulmonary:     Effort: Pulmonary effort is normal. No respiratory distress.     Breath sounds: Normal breath sounds. No wheezing.  Abdominal:     General: Bowel sounds are normal. There is no distension.     Palpations: Abdomen is soft. There is no mass.     Tenderness: There is no abdominal tenderness. There is no guarding or rebound.     Hernia: No hernia is present.  Musculoskeletal:     Cervical back: Normal range of motion.  Skin:    General: Skin is warm and dry.  Neurological:     Mental Status: She is alert.  Psychiatric:        Behavior: Behavior is cooperative.      UC Treatments / Results  Labs (all  labs ordered are listed, but only abnormal results are displayed) Labs Reviewed  POCT RAPID STREP A (OFFICE)    EKG   Radiology No results found.  Procedures Procedures (including critical care time)  Medications Ordered in UC Medications  ibuprofen (ADVIL) tablet 800 mg (800 mg Oral Given 10/08/22 1711)  dexamethasone (DECADRON) injection 10 mg (10 mg Intramuscular Given 10/08/22 1735)    Initial Impression / Assessment and Plan / UC Course  I have reviewed the triage vital signs and the nursing notes.  Pertinent labs & imaging results that were available during my care of the patient were reviewed by me and considered in my medical decision making (see chart for details).     Patient presented with 1 week history of sore throat, fever, body aches, chills, sweats, headache, and slight bellyache.  Patient reports taking TheraFlu, Mucinex, and Goody powder with no relief.  Denies abdominal pain, nausea, vomiting, diarrhea, cough, and shortness of breath.  Patient is febrile in clinic.  Ordered ibuprofen.  Upon assessment patient has significant edema and erythema to right tonsil.  Tonsillar abscess present.  Patient reports having abscess 2 years ago and prescribed Augmentin which relieved abscess.  Patient endorses some trouble swallowing.  Denies difficulty breathing.  Prescribed Augmentin for tonsillar abscess.  Prescribed lidocaine as needed for sore throat.  Recommended ibuprofen and Tylenol as needed for pain and fever. Discussed follow-up, return, emergency department precautions. Final Clinical Impressions(s) / UC Diagnoses   Final diagnoses:  Tonsillar abscess  Sore throat     Discharge Instructions      Please start taking antibiotic as prescribed  until finished. You can use lidocaine as needed for sore throat. You can also alternate between Tylenol and Ibuprofen as needed for pain and fever. Follow-up with ENT or return here if symptoms persist.    ED Prescriptions      Medication Sig Dispense Auth. Provider   amoxicillin-clavulanate (AUGMENTIN) 875-125 MG tablet Take 1 tablet by mouth every 12 (twelve) hours for 7 days. 14 tablet Susann Givens, Eliberto Sole A, NP   ibuprofen (ADVIL) 800 MG tablet Take 1 tablet (800 mg total) by mouth 3 (three) times daily as needed for fever, mild pain or moderate pain. 21 tablet Wynonia Lawman A, NP   acetaminophen (TYLENOL) 500 MG tablet Take 2 tablets (1,000 mg total) by mouth every 6 (six) hours as needed for mild pain or moderate pain. 30 tablet Susann Givens, Everest Hacking A, NP   lidocaine (XYLOCAINE) 2 % solution Use as directed 15 mLs in the mouth or throat as needed for mouth pain. 100 mL Wynonia Lawman A, NP      PDMP not reviewed this encounter.   Wynonia Lawman A, NP 10/08/22 [phone]

## 2023-01-31 ENCOUNTER — Other Ambulatory Visit: Payer: Self-pay | Admitting: Family Medicine

## 2023-01-31 DIAGNOSIS — Z1231 Encounter for screening mammogram for malignant neoplasm of breast: Secondary | ICD-10-CM

## 2023-02-19 NOTE — Therapy (Deleted)
OUTPATIENT PHYSICAL THERAPY THORACOLUMBAR EVALUATION   Patient Name: Morgan Jacobson MRN: [id number] DOB:[birth year], 58 y.o., female Today's Date: 02/19/2023  END OF SESSION:   Past Medical History:  Diagnosis Date   Hyperlipidemia    Hypertension    Lumbago    Past Surgical History:  Procedure Laterality Date   LEG SURGERY Left    There are no active problems to display for this patient.   PCP: Mirna Mires, MD   REFERRING PROVIDER: Jamey Reas, PA-C  REFERRING DIAG: 47.27 (ICD-10-CM) - Lumbosacral spondylosis with radiculopathy  Rationale for Evaluation and Treatment: Rehabilitation  THERAPY DIAG:  No diagnosis found.  ONSET DATE: chronic  SUBJECTIVE:                                                                                                                                                                                           SUBJECTIVE STATEMENT: ***  PERTINENT HISTORY:  ***  PAIN:  Are you having pain? {OPRCPAIN:27236}  PRECAUTIONS: None  RED FLAGS: None   WEIGHT BEARING RESTRICTIONS: No  FALLS:  Has patient fallen in last 6 months? No  OCCUPATION: ***  PLOF: Independent  PATIENT GOALS: ***  NEXT MD VISIT: ***  OBJECTIVE:  Note: Objective measures were completed at Evaluation unless otherwise noted.  DIAGNOSTIC FINDINGS:  None recent  PATIENT SURVEYS:  Modified Oswestry ***   MUSCLE LENGTH: Hamstrings: Right *** deg; Left *** deg Maisie Fus test: Right *** deg; Left *** deg  POSTURE: {posture:25561}  PALPATION: ***  LUMBAR ROM:   AROM eval  Flexion   Extension   Right lateral flexion   Left lateral flexion   Right rotation   Left rotation    (Blank rows = not tested)  LOWER EXTREMITY ROM:     {AROM/PROM:27142}  Right eval Left eval  Hip flexion    Hip extension    Hip abduction    Hip adduction    Hip internal rotation    Hip external rotation    Knee flexion    Knee extension    Ankle  dorsiflexion    Ankle plantarflexion    Ankle inversion    Ankle eversion     (Blank rows = not tested)  LOWER EXTREMITY MMT:    MMT Right eval Left eval  Hip flexion    Hip extension    Hip abduction    Hip adduction    Hip internal rotation    Hip external rotation    Knee flexion    Knee extension    Ankle dorsiflexion    Ankle plantarflexion    Ankle inversion  Ankle eversion     (Blank rows = not tested)  LUMBAR SPECIAL TESTS:  Straight leg raise test: {pos/neg:25243} and Slump test: {pos/neg:25243}  FUNCTIONAL TESTS:  30 seconds chair stand test  GAIT: Distance walked: *** Assistive device utilized: {Assistive devices:23999} Level of assistance: {Levels of assistance:24026} Comments: ***  TREATMENT DATE: ***                                                                                                                                 PATIENT EDUCATION:  Education details: Discussed eval findings, rehab rationale and POC and patient is in agreement  Person educated: Patient Education method: Explanation Education comprehension: verbalized understanding and needs further education  HOME EXERCISE PROGRAM: ***  ASSESSMENT:  CLINICAL IMPRESSION: Patient is a *** y.o. *** who was seen today for physical therapy evaluation and treatment for ***.   OBJECTIVE IMPAIRMENTS: {opptimpairments:25111}.   ACTIVITY LIMITATIONS: {activitylimitations:27494}  PARTICIPATION LIMITATIONS: {participationrestrictions:25113}  PERSONAL FACTORS: {Personal factors:25162} are also affecting patient's functional outcome.   REHAB POTENTIAL: Good  CLINICAL DECISION MAKING: Stable/uncomplicated  EVALUATION COMPLEXITY: Low   GOALS: Goals reviewed with patient? No  SHORT TERM GOALS: Target date: ***  *** Baseline: Goal status: INITIAL  2.  *** Baseline:  Goal status: INITIAL  3.  *** Baseline:  Goal status: INITIAL  4.  *** Baseline:  Goal status:  INITIAL  5.  *** Baseline:  Goal status: INITIAL  6.  *** Baseline:  Goal status: INITIAL  LONG TERM GOALS: Target date: ***  *** Baseline:  Goal status: INITIAL  2.  *** Baseline:  Goal status: INITIAL  3.  *** Baseline:  Goal status: INITIAL  4.  *** Baseline:  Goal status: INITIAL  5.  *** Baseline:  Goal status: INITIAL  6.  *** Baseline:  Goal status: INITIAL  PLAN:  PT FREQUENCY: 1x/week  PT DURATION: 4 weeks  PLANNED INTERVENTIONS: 97164- PT Re-evaluation, 97110-Therapeutic exercises, 97530- Therapeutic activity, 97112- Neuromuscular re-education, 97535- Self Care, and 21308- Manual therapy.  PLAN FOR NEXT SESSION: HEP review and update, manual techniques as appropriate, aerobic tasks, ROM and flexibility activities, strengthening and PREs, TPDN, gait and balance training as needed     Hildred Laser, PT 02/19/2023, 8:41 AM

## 2023-02-20 ENCOUNTER — Ambulatory Visit: Payer: Medicaid Other

## 2023-02-27 NOTE — Therapy (Addendum)
 OUTPATIENT PHYSICAL THERAPY THORACOLUMBAR EVALUATION/DISCHARGE   Patient Name: Morgan Jacobson MRN: [id number] DOB:1965-04-13, 58 y.o., female Today's Date: 03/01/2023  END OF SESSION:  PT End of Session - 03/01/23 1702     Visit Number 1    Number of Visits 5    Date for PT Re-Evaluation 04/29/23    Authorization Type MCD    PT Start Time 1700    PT Stop Time 1740    PT Time Calculation (min) 40 min             Past Medical History:  Diagnosis Date   Hyperlipidemia    Hypertension    Lumbago    Past Surgical History:  Procedure Laterality Date   LEG SURGERY Left    There are no active problems to display for this patient.   PCP: Leigh Lung, MD   REFERRING PROVIDER: Jerome Heron Ruth, PA-C  REFERRING DIAG: 47.27 (ICD-10-CM) - Lumbosacral spondylosis with radiculopathy  Rationale for Evaluation and Treatment: Rehabilitation  THERAPY DIAG:  Other low back pain - Plan: PT plan of care cert/re-cert  Muscle weakness (generalized) - Plan: PT plan of care cert/re-cert  Other abnormalities of gait and mobility - Plan: PT plan of care cert/re-cert  ONSET DATE: chronic  SUBJECTIVE:                                                                                                                                                                                           SUBJECTIVE STATEMENT: Chronic low back pain ongoing over many years.  Multiple PT episodes with no benefit, ESI in the past with no benefit.   PERTINENT HISTORY:    PAIN:  Are you having pain? Yes: NPRS scale: 6-8/10 Pain location: low back Pain description: ache Aggravating factors: walking, prolonged  Relieving factors: sitting, rest  PRECAUTIONS: None  RED FLAGS: None   WEIGHT BEARING RESTRICTIONS: No  FALLS:  Has patient fallen in last 6 months? No  OCCUPATION: not working  PLOF: Independent  PATIENT GOALS: To manage my back pain  NEXT MD VISIT: TBD  OBJECTIVE:   Note: Objective measures were completed at Evaluation unless otherwise noted.  DIAGNOSTIC FINDINGS:  None recent  PATIENT SURVEYS:  Modified Oswestry 30/50 60% perceived disability   MUSCLE LENGTH:   POSTURE: rounded shoulders, decreased lumbar lordosis, and flexed trunk   PALPATION: deferred  LUMBAR ROM:   AROM eval  Flexion   Extension   Right lateral flexion   Left lateral flexion   Right rotation 50%  Left rotation 50%   (Blank rows = not tested)  LOWER EXTREMITY ROM:   WFL for  gait and transfers  Active  Right eval Left eval  Hip flexion    Hip extension    Hip abduction    Hip adduction    Hip internal rotation    Hip external rotation    Knee flexion    Knee extension    Ankle dorsiflexion    Ankle plantarflexion    Ankle inversion    Ankle eversion     (Blank rows = not tested)  LOWER EXTREMITY MMT:    MMT Right eval Left eval  Hip flexion    Hip extension    Hip abduction    Hip adduction    Hip internal rotation    Hip external rotation    Knee flexion    Knee extension    Ankle dorsiflexion    Ankle plantarflexion    Ankle inversion    Ankle eversion     (Blank rows = not tested)  LUMBAR SPECIAL TESTS:  Straight leg raise test: Negative and Slump test: Negative  FUNCTIONAL TESTS:  30 seconds chair stand test 9 w/o UE Support  GAIT: Distance walked: 2ftx2 Assistive device utilized: None Level of assistance: Complete Independence Comments: slow cadence  TREATMENT DATE:  [facility] Adult PT Treatment:                                                DATE: 03/01/23 Eval and HEP                                                                                                               PATIENT EDUCATION:  Education details: Discussed eval findings, rehab rationale and POC and patient is in agreement  Person educated: Patient Education method: Explanation Education comprehension: verbalized understanding and needs further  education  HOME EXERCISE PROGRAM: Access Code: [email or web address] URL: [email or web address] Date: 03/01/2023 Prepared by: Jaidev Sanger  Exercises - Supine Single Knee to Chest Stretch  - 2 x daily - 7 x weekly - 1 sets - 5 reps - 10 hold - Supine Lower Trunk Rotation  - 2 x daily - 7 x weekly - 1 sets - 5 reps - 20 hold - Sit to Stand with Armchair  - 1 x daily - 7 x weekly - 3 sets - 8 reps - Beginner Bridge  - 1 x daily - 7 x weekly - 2 sets - 8 reps  ASSESSMENT:  CLINICAL IMPRESSION: Patient is a 58 y.o. female who was seen today for physical therapy evaluation and treatment for chronic low back pain. No neuro signs elicited.  Marked restrictions in trunk extension with good LE strength observed.  Patient should benefit from OPPT providing she remains consistent with home stretching program.  OBJECTIVE IMPAIRMENTS: Abnormal gait, decreased activity tolerance, decreased endurance, decreased knowledge of condition, decreased mobility, difficulty walking, decreased ROM, decreased strength, postural dysfunction, obesity, and pain.   ACTIVITY LIMITATIONS: carrying, lifting, bending,  sitting, standing, squatting, and stairs  PERSONAL FACTORS: Age, Behavior pattern, Fitness, and Past/current experiences are also affecting patient's functional outcome.   REHAB POTENTIAL: Good  CLINICAL DECISION MAKING: Stable/uncomplicated  EVALUATION COMPLEXITY: Low   GOALS: Goals reviewed with patient? No  SHORT TERM GOALS=LONG TERM GOALS: Target date: 04/29/23  Patient to demonstrate independence in HEP  Baseline: [id number] Goal status: INITIAL  2.  Patient will acknowledge 5/10 best pain at least once during episode of care   Baseline: 6/10 Goal status: INITIAL  3.  Patient will score at least 22% on FOTO to signify clinically meaningful improvement in functional abilities.   Baseline: 30% perceived disability Goal status: INITIAL  4.  Patient will increase 30s chair stand reps  from 9 to 12 with/without arms to demonstrate and improved functional ability with less pain/difficulty as well as reduce fall risk.  Baseline: 9 Goal status: INITIAL     PLAN:  PT FREQUENCY: 1x/week  PT DURATION: 4 weeks  PLANNED INTERVENTIONS: 97164- PT Re-evaluation, 97110-Therapeutic exercises, 97530- Therapeutic activity, 97112- Neuromuscular re-education, 97535- Self Care, and 02859- Manual therapy.  PLAN FOR NEXT SESSION: HEP review and update, manual techniques as appropriate, aerobic tasks, ROM and flexibility activities, strengthening and PREs, TPDN, gait and balance training as needed    For all possible CPT codes, reference the Planned Interventions line above.     Check all conditions that are expected to impact treatment: {Conditions expected to impact treatment:Morbid obesity and Presence of Medical Equipment   If treatment provided at initial evaluation, no treatment charged due to lack of authorization.       Tereka Thorley M Franchot Pollitt, PT 03/01/2023, 5:37 PM

## 2023-03-01 ENCOUNTER — Other Ambulatory Visit: Payer: Self-pay

## 2023-03-01 ENCOUNTER — Ambulatory Visit: Payer: Medicaid Other | Attending: Physician Assistant

## 2023-03-01 DIAGNOSIS — M5459 Other low back pain: Secondary | ICD-10-CM | POA: Diagnosis present

## 2023-03-01 DIAGNOSIS — M6281 Muscle weakness (generalized): Secondary | ICD-10-CM | POA: Diagnosis present

## 2023-03-01 DIAGNOSIS — R2689 Other abnormalities of gait and mobility: Secondary | ICD-10-CM | POA: Diagnosis present

## 2023-03-06 NOTE — Therapy (Deleted)
 OUTPATIENT PHYSICAL THERAPY THORACOLUMBAR EVALUATION   Patient Name: Morgan Jacobson MRN: [id number] DOB:[birth year], 58 y.o., female Today's Date: 03/06/2023  END OF SESSION:    Past Medical History:  Diagnosis Date   Hyperlipidemia    Hypertension    Lumbago    Past Surgical History:  Procedure Laterality Date   LEG SURGERY Left    There are no active problems to display for this patient.   PCP: Mirna Mires, MD   REFERRING PROVIDER: Jamey Reas, PA-C  REFERRING DIAG: 47.27 (ICD-10-CM) - Lumbosacral spondylosis with radiculopathy  Rationale for Evaluation and Treatment: Rehabilitation  THERAPY DIAG:  No diagnosis found.  ONSET DATE: chronic  SUBJECTIVE:                                                                                                                                                                                           SUBJECTIVE STATEMENT: Chronic low back pain ongoing over many years.  Multiple PT episodes with no benefit, ESI in the past with no benefit.   PERTINENT HISTORY:    PAIN:  Are you having pain? Yes: NPRS scale: 6-8/10 Pain location: low back Pain description: ache Aggravating factors: walking, prolonged  Relieving factors: sitting, rest  PRECAUTIONS: None  RED FLAGS: None   WEIGHT BEARING RESTRICTIONS: No  FALLS:  Has patient fallen in last 6 months? No  OCCUPATION: not working  PLOF: Independent  PATIENT GOALS: To manage my back pain  NEXT MD VISIT: TBD  OBJECTIVE:  Note: Objective measures were completed at Evaluation unless otherwise noted.  DIAGNOSTIC FINDINGS:  None recent  PATIENT SURVEYS:  Modified Oswestry 30/50 60% perceived disability   MUSCLE LENGTH:   POSTURE: rounded shoulders, decreased lumbar lordosis, and flexed trunk   PALPATION: deferred  LUMBAR ROM:   AROM eval  Flexion   Extension   Right lateral flexion   Left lateral flexion   Right rotation 50%  Left  rotation 50%   (Blank rows = not tested)  LOWER EXTREMITY ROM:   WFL for gait and transfers  Active  Right eval Left eval  Hip flexion    Hip extension    Hip abduction    Hip adduction    Hip internal rotation    Hip external rotation    Knee flexion    Knee extension    Ankle dorsiflexion    Ankle plantarflexion    Ankle inversion    Ankle eversion     (Blank rows = not tested)  LOWER EXTREMITY MMT:    MMT Right eval Left eval  Hip flexion    Hip extension  Hip abduction    Hip adduction    Hip internal rotation    Hip external rotation    Knee flexion    Knee extension    Ankle dorsiflexion    Ankle plantarflexion    Ankle inversion    Ankle eversion     (Blank rows = not tested)  LUMBAR SPECIAL TESTS:  Straight leg raise test: Negative and Slump test: Negative  FUNCTIONAL TESTS:  30 seconds chair stand test 9 w/o UE Support  GAIT: Distance walked: 38ftx2 Assistive device utilized: None Level of assistance: Complete Independence Comments: slow cadence  TREATMENT DATE:  [facility] Adult PT Treatment:                                                DATE: 03/01/23 Eval and HEP                                                                                                               PATIENT EDUCATION:  Education details: Discussed eval findings, rehab rationale and POC and patient is in agreement  Person educated: Patient Education method: Explanation Education comprehension: verbalized understanding and needs further education  HOME EXERCISE PROGRAM: Access Code: [email or web address] URL: [email or web address] Date: 03/01/2023 Prepared by: Gustavus Bryant  Exercises - Supine Single Knee to Chest Stretch  - 2 x daily - 7 x weekly - 1 sets - 5 reps - 10 hold - Supine Lower Trunk Rotation  - 2 x daily - 7 x weekly - 1 sets - 5 reps - 20 hold - Sit to Stand with Armchair  - 1 x daily - 7 x weekly - 3 sets - 8 reps - Beginner Bridge  - 1 x daily - 7  x weekly - 2 sets - 8 reps  ASSESSMENT:  CLINICAL IMPRESSION: Patient is a 58 y.o. female who was seen today for physical therapy evaluation and treatment for chronic low back pain. No neuro signs elicited.  Marked restrictions in trunk extension with good LE strength observed.  Patient should benefit from OPPT providing she remains consistent with home stretching program.  OBJECTIVE IMPAIRMENTS: Abnormal gait, decreased activity tolerance, decreased endurance, decreased knowledge of condition, decreased mobility, difficulty walking, decreased ROM, decreased strength, postural dysfunction, obesity, and pain.   ACTIVITY LIMITATIONS: carrying, lifting, bending, sitting, standing, squatting, and stairs  PERSONAL FACTORS: Age, Behavior pattern, Fitness, and Past/current experiences are also affecting patient's functional outcome.   REHAB POTENTIAL: Good  CLINICAL DECISION MAKING: Stable/uncomplicated  EVALUATION COMPLEXITY: Low   GOALS: Goals reviewed with patient? No  SHORT TERM GOALS=LONG TERM GOALS: Target date: 04/29/23  Patient to demonstrate independence in HEP  Baseline: [id number] Goal status: INITIAL  2.  Patient will acknowledge 5/10 best pain at least once during episode of care   Baseline: 6/10 Goal status: INITIAL  3.  Patient will score at least 22% on FOTO to signify clinically  meaningful improvement in functional abilities.   Baseline: 30% perceived disability Goal status: INITIAL  4.  Patient will increase 30s chair stand reps from 9 to 12 with/without arms to demonstrate and improved functional ability with less pain/difficulty as well as reduce fall risk.  Baseline: 9 Goal status: INITIAL     PLAN:  PT FREQUENCY: 1x/week  PT DURATION: 4 weeks  PLANNED INTERVENTIONS: 97164- PT Re-evaluation, 97110-Therapeutic exercises, 97530- Therapeutic activity, 97112- Neuromuscular re-education, 97535- Self Care, and 54098- Manual therapy.  PLAN FOR NEXT SESSION: HEP  review and update, manual techniques as appropriate, aerobic tasks, ROM and flexibility activities, strengthening and PREs, TPDN, gait and balance training as needed    For all possible CPT codes, reference the Planned Interventions line above.     Check all conditions that are expected to impact treatment: {Conditions expected to impact treatment:Morbid obesity and Presence of Medical Equipment   If treatment provided at initial evaluation, no treatment charged due to lack of authorization.       Hildred Laser, PT 03/06/2023, 12:38 PM

## 2023-03-08 ENCOUNTER — Ambulatory Visit: Payer: Medicaid Other

## 2023-03-12 ENCOUNTER — Ambulatory Visit
Admission: RE | Admit: 2023-03-12 | Discharge: 2023-03-12 | Disposition: A | Discharge status: Home / Self Care | Payer: Medicaid Other | Source: Ambulatory Visit | Attending: Family Medicine | Admitting: Family Medicine

## 2023-03-12 DIAGNOSIS — Z1231 Encounter for screening mammogram for malignant neoplasm of breast: Secondary | ICD-10-CM

## 2024-02-12 ENCOUNTER — Other Ambulatory Visit: Payer: Self-pay | Admitting: Family Medicine

## 2024-02-12 DIAGNOSIS — Z1231 Encounter for screening mammogram for malignant neoplasm of breast: Secondary | ICD-10-CM

## 2024-03-12 ENCOUNTER — Ambulatory Visit
